# Patient Record
Sex: Female | Born: 1935 | Race: White | Hispanic: No | Smoking: Former smoker
Health system: Southern US, Community
[De-identification: ages and names within clinical notes are randomized; demographics above are authoritative.]

## PROBLEM LIST (undated history)

## (undated) DIAGNOSIS — C801 Malignant (primary) neoplasm, unspecified: Secondary | ICD-10-CM

## (undated) DIAGNOSIS — H409 Unspecified glaucoma: Secondary | ICD-10-CM

## (undated) HISTORY — PX: RECTAL PROLAPSE REPAIR: SHX759

## (undated) HISTORY — PX: VAGINAL PROLAPSE REPAIR: SHX830

## (undated) HISTORY — PX: CYSTECTOMY: SUR359

## (undated) HISTORY — PX: BREAST BIOPSY: SHX20

## (undated) HISTORY — DX: Unspecified glaucoma: H40.9

## (undated) HISTORY — PX: ABDOMINAL HYSTERECTOMY: SHX81

---

## 2005-10-07 ENCOUNTER — Ambulatory Visit: Payer: Self-pay | Admitting: Internal Medicine

## 2007-01-05 ENCOUNTER — Ambulatory Visit: Payer: Self-pay | Admitting: Internal Medicine

## 2008-01-09 ENCOUNTER — Ambulatory Visit: Payer: Self-pay | Admitting: Internal Medicine

## 2009-01-14 ENCOUNTER — Ambulatory Visit: Payer: Self-pay | Admitting: Internal Medicine

## 2010-01-21 ENCOUNTER — Ambulatory Visit: Payer: Self-pay | Admitting: Internal Medicine

## 2011-02-11 ENCOUNTER — Ambulatory Visit: Payer: Self-pay | Admitting: Internal Medicine

## 2011-04-07 ENCOUNTER — Emergency Department: Payer: Self-pay | Admitting: Internal Medicine

## 2011-04-15 ENCOUNTER — Ambulatory Visit: Payer: Self-pay | Admitting: Obstetrics and Gynecology

## 2011-04-15 DIAGNOSIS — I10 Essential (primary) hypertension: Secondary | ICD-10-CM

## 2011-04-26 ENCOUNTER — Ambulatory Visit: Payer: Self-pay | Admitting: Obstetrics and Gynecology

## 2012-07-10 ENCOUNTER — Ambulatory Visit: Payer: Self-pay | Admitting: Obstetrics and Gynecology

## 2012-09-02 ENCOUNTER — Emergency Department: Payer: Self-pay | Admitting: Internal Medicine

## 2012-09-02 LAB — COMPREHENSIVE METABOLIC PANEL
Albumin: 3.9 g/dL (ref 3.4–5.0)
Alkaline Phosphatase: 80 U/L (ref 50–136)
Anion Gap: 10 (ref 7–16)
Bilirubin,Total: 0.5 mg/dL (ref 0.2–1.0)
Calcium, Total: 8.7 mg/dL (ref 8.5–10.1)
Chloride: 96 mmol/L — ABNORMAL LOW (ref 98–107)
Co2: 23 mmol/L (ref 21–32)
EGFR (Non-African Amer.): >60
Glucose: 137 mg/dL — ABNORMAL HIGH (ref 65–99)
Potassium: 3.9 mmol/L (ref 3.5–5.1)
SGOT(AST): 23 U/L (ref 15–37)
Sodium: 129 mmol/L — ABNORMAL LOW (ref 136–145)
Total Protein: 6.9 g/dL (ref 6.4–8.2)

## 2012-09-02 LAB — CBC
HCT: 37.3 % (ref 35.0–47.0)
HGB: 13 g/dL (ref 12.0–16.0)
MCHC: 34.7 g/dL (ref 32.0–36.0)
MCV: 94 fL (ref 80–100)
RBC: 3.97 10*6/uL (ref 3.80–5.20)
RDW: 12.3 % (ref 11.5–14.5)
WBC: 8.8 10*3/uL (ref 3.6–11.0)

## 2012-09-08 ENCOUNTER — Ambulatory Visit: Payer: Self-pay | Admitting: Internal Medicine

## 2013-07-30 ENCOUNTER — Ambulatory Visit: Payer: Self-pay | Admitting: Obstetrics and Gynecology

## 2013-08-15 ENCOUNTER — Ambulatory Visit: Payer: Self-pay | Admitting: Obstetrics and Gynecology

## 2014-05-01 DIAGNOSIS — N368 Other specified disorders of urethra: Secondary | ICD-10-CM | POA: Insufficient documentation

## 2014-05-01 DIAGNOSIS — N3941 Urge incontinence: Secondary | ICD-10-CM | POA: Insufficient documentation

## 2014-05-01 DIAGNOSIS — N393 Stress incontinence (female) (male): Secondary | ICD-10-CM | POA: Insufficient documentation

## 2014-07-19 DIAGNOSIS — J019 Acute sinusitis, unspecified: Secondary | ICD-10-CM | POA: Insufficient documentation

## 2014-08-16 ENCOUNTER — Ambulatory Visit: Payer: Self-pay | Admitting: Otolaryngology

## 2014-09-11 ENCOUNTER — Ambulatory Visit: Payer: Self-pay | Admitting: Obstetrics and Gynecology

## 2014-10-28 DIAGNOSIS — R7309 Other abnormal glucose: Secondary | ICD-10-CM | POA: Insufficient documentation

## 2015-02-10 ENCOUNTER — Encounter: Payer: Self-pay | Admitting: Podiatry

## 2015-02-10 ENCOUNTER — Ambulatory Visit (INDEPENDENT_AMBULATORY_CARE_PROVIDER_SITE_OTHER): Payer: Medicare Other | Admitting: Podiatry

## 2015-02-10 VITALS — BP 149/86 | HR 73 | Resp 16 | Ht 61.0 in | Wt 112.0 lb

## 2015-02-10 DIAGNOSIS — C4370 Malignant melanoma of unspecified lower limb, including hip: Secondary | ICD-10-CM | POA: Insufficient documentation

## 2015-02-10 DIAGNOSIS — M79606 Pain in leg, unspecified: Secondary | ICD-10-CM

## 2015-02-10 DIAGNOSIS — E785 Hyperlipidemia, unspecified: Secondary | ICD-10-CM | POA: Insufficient documentation

## 2015-02-10 DIAGNOSIS — E871 Hypo-osmolality and hyponatremia: Secondary | ICD-10-CM | POA: Insufficient documentation

## 2015-02-10 DIAGNOSIS — B351 Tinea unguium: Secondary | ICD-10-CM | POA: Diagnosis not present

## 2015-02-10 DIAGNOSIS — N952 Postmenopausal atrophic vaginitis: Secondary | ICD-10-CM | POA: Insufficient documentation

## 2015-02-10 DIAGNOSIS — E041 Nontoxic single thyroid nodule: Secondary | ICD-10-CM | POA: Insufficient documentation

## 2015-02-10 DIAGNOSIS — H409 Unspecified glaucoma: Secondary | ICD-10-CM | POA: Insufficient documentation

## 2015-02-10 DIAGNOSIS — L719 Rosacea, unspecified: Secondary | ICD-10-CM | POA: Insufficient documentation

## 2015-02-10 DIAGNOSIS — E162 Hypoglycemia, unspecified: Secondary | ICD-10-CM | POA: Insufficient documentation

## 2015-02-10 NOTE — Progress Notes (Signed)
   Subjective:    Patient ID: Cathy Newman, female    DOB: 1925/05/31, 81 y.o.   MRN: [id number]  HPI i need my toenails trimmed and i feel like i am standing crooked. My right leg seems to be longer than the left .  So i may need something in my shoe to lift me up .     Review of Systems  All other systems reviewed and are negative.      Objective:   Physical Exam: I have reviewed her past mental history medications allergies surgery social history and review of systems. Pulses are strongly palpable bilateral. Neurologic sensorium is intact per Semmes-Weinstein monofilament. Deep tendon reflexes are intact bilateral and muscle strength is 5 over 5 dorsiflexion plantar flexors and inverters and everters all intrinsic musculature is intact. Orthopedic evaluation demonstrates severe hallux abductovalgus deformity hammertoe deformities bilateral. Minimal leg length discrepancy on evaluation of shoulders hips knees and medial malleolus. She does pronate more in her right foot. Cutaneous evaluation demonstrates supple well-hydrated cutis no erythema edema cellulitis drainage or odor. Her nails are thick yellow dystrophic onychomycotic and painful palpation.        Assessment & Plan:  Assessment: Assessment is pes planus right foot severe hallux valgus deformities bilateral and pain in limb secondary to onychomycosis bilateral.  Plan: Dispensed a pair of power step insoles and debrided nails 1 through 5 bilateral.

## 2015-05-12 ENCOUNTER — Ambulatory Visit (INDEPENDENT_AMBULATORY_CARE_PROVIDER_SITE_OTHER): Payer: Medicare Other | Admitting: Podiatry

## 2015-05-12 DIAGNOSIS — B351 Tinea unguium: Secondary | ICD-10-CM

## 2015-05-12 DIAGNOSIS — M79606 Pain in leg, unspecified: Secondary | ICD-10-CM | POA: Diagnosis not present

## 2015-05-12 NOTE — Progress Notes (Signed)
S: Returns today with CC of thickened/elongated nails  O: Pulses are intact with normal capillary fill time. Sensorium intact. 1-5 nails bilateral are thick     /dystrophic/mycotic and painful to palpation. Mild  Dorsal corn noted on R 2nd toe  A: onychomycosis 1-5 bilateral with pain  P: Debridement of painful 1-5 mycotic nails bilateral. Powerstep 3/4 length orthotics dispensed at pt.'s      Request, as previous pair quite helpful. Will see in 3 months as needed.

## 2015-05-13 DIAGNOSIS — M79673 Pain in unspecified foot: Secondary | ICD-10-CM

## 2015-05-26 ENCOUNTER — Other Ambulatory Visit: Payer: Self-pay | Admitting: Ophthalmology

## 2015-05-26 DIAGNOSIS — H34211 Partial retinal artery occlusion, right eye: Secondary | ICD-10-CM

## 2015-05-28 ENCOUNTER — Ambulatory Visit
Admission: RE | Admit: 2015-05-28 | Discharge: 2015-05-28 | Disposition: A | Discharge status: Home / Self Care | Payer: Medicare Other | Source: Ambulatory Visit | Attending: Ophthalmology | Admitting: Ophthalmology

## 2015-05-28 DIAGNOSIS — H34211 Partial retinal artery occlusion, right eye: Secondary | ICD-10-CM | POA: Diagnosis present

## 2015-05-28 DIAGNOSIS — I6523 Occlusion and stenosis of bilateral carotid arteries: Secondary | ICD-10-CM | POA: Diagnosis not present

## 2015-08-14 ENCOUNTER — Ambulatory Visit: Payer: Medicare Other

## 2015-08-19 ENCOUNTER — Encounter: Payer: Self-pay | Admitting: Obstetrics and Gynecology

## 2015-08-22 ENCOUNTER — Other Ambulatory Visit: Payer: Self-pay

## 2015-08-22 MED ORDER — ESTRADIOL 0.1 MG/GM VA CREA: 1.0000 | TOPICAL_CREAM | Freq: Every day | VAGINAL | Status: DC

## 2015-08-25 ENCOUNTER — Telehealth: Payer: Self-pay | Admitting: Obstetrics and Gynecology

## 2015-08-25 DIAGNOSIS — Z1231 Encounter for screening mammogram for malignant neoplasm of breast: Secondary | ICD-10-CM

## 2015-08-25 NOTE — Telephone Encounter (Signed)
[facility]ill on [facility]race [facility]) she needs her mammogram this falll if dr Tennis Must can order that.

## 2015-08-25 NOTE — Telephone Encounter (Signed)
Pt aware estrace erx on 9/16 and mammo ordered. Due after 10/7.

## 2015-08-28 ENCOUNTER — Ambulatory Visit (INDEPENDENT_AMBULATORY_CARE_PROVIDER_SITE_OTHER): Payer: Medicare Other | Admitting: Podiatry

## 2015-08-28 ENCOUNTER — Ambulatory Visit: Payer: Medicare Other

## 2015-08-28 ENCOUNTER — Encounter: Payer: Self-pay | Admitting: Podiatry

## 2015-08-28 DIAGNOSIS — M79606 Pain in leg, unspecified: Secondary | ICD-10-CM

## 2015-08-28 DIAGNOSIS — B351 Tinea unguium: Secondary | ICD-10-CM | POA: Diagnosis not present

## 2015-08-28 NOTE — Progress Notes (Signed)
Patient ID: Cathy Newman, female   DOB: 05-29-1925, 81 y.o.   MRN: [id number]  Subjective: 81 y.o. returns the office today for painful, elongated, thickened toenails and calluses which she is unable to trim herself. Denies any redness or drainage around the nails/calluses. Denies any acute changes since last appointment and no new complaints today. Denies any systemic complaints such as fevers, chills, nausea, vomiting.   Objective: AAO 3, NAD DP/PT pulses palpable, CRT less than 3 seconds Protective sensation intact with Simms Weinstein monofilament Nails hypertrophic, dystrophic, elongated, brittle, discolored 10. There is tenderness overlying the nails 1-5 bilaterally. There is no surrounding erythema or drainage along the nail sites. Right foot submetatarsal 2 hyperkeratotic lesion. Upon debridement there was no underlying ulceration, drainage or other signs of infection. No open lesions or pre-ulcerative lesions are identified. No other areas of tenderness bilateral lower extremities. No overlying edema, erythema, increased warmth. No pain with calf compression, swelling, warmth, erythema.  Assessment: Patient presents with symptomatic onychomycosis, hyperkerotic lesion  Plan: -Treatment options including alternatives, risks, complications were discussed -Nails sharply debrided 10 without complication/bleeding. -Hyperkeratotic lesion sharply debrided 1 without complication/bleeding. -Discussed daily foot inspection. If there are any changes, to call the office immediately.  -Follow-up in 3 months or sooner if any problems are to arise. In the meantime, encouraged to call the office with any questions, concerns, changes symptoms.  Celesta Gentile, DPM

## 2015-09-16 ENCOUNTER — Other Ambulatory Visit: Payer: Self-pay | Admitting: Obstetrics and Gynecology

## 2015-09-16 ENCOUNTER — Ambulatory Visit
Admission: RE | Admit: 2015-09-16 | Discharge: 2015-09-16 | Disposition: A | Discharge status: Home / Self Care | Payer: Medicare Other | Source: Ambulatory Visit | Attending: Obstetrics and Gynecology | Admitting: Obstetrics and Gynecology

## 2015-09-16 DIAGNOSIS — Z1231 Encounter for screening mammogram for malignant neoplasm of breast: Secondary | ICD-10-CM | POA: Diagnosis not present

## 2015-09-16 HISTORY — DX: Malignant (primary) neoplasm, unspecified: C80.1

## 2015-11-20 ENCOUNTER — Ambulatory Visit: Payer: Medicare Other

## 2015-11-21 ENCOUNTER — Ambulatory Visit (INDEPENDENT_AMBULATORY_CARE_PROVIDER_SITE_OTHER): Payer: Medicare Other | Admitting: Sports Medicine

## 2015-11-21 ENCOUNTER — Encounter: Payer: Self-pay | Admitting: Sports Medicine

## 2015-11-21 DIAGNOSIS — B351 Tinea unguium: Secondary | ICD-10-CM

## 2015-11-21 DIAGNOSIS — M2040 Other hammer toe(s) (acquired), unspecified foot: Secondary | ICD-10-CM

## 2015-11-21 DIAGNOSIS — M21619 Bunion of unspecified foot: Secondary | ICD-10-CM

## 2015-11-21 DIAGNOSIS — M79674 Pain in right toe(s): Secondary | ICD-10-CM

## 2015-11-21 DIAGNOSIS — M79675 Pain in left toe(s): Secondary | ICD-10-CM

## 2015-11-21 NOTE — Progress Notes (Signed)
Patient ID: Cathy Newman, female   DOB: 09-12-1925, 81 y.o.   MRN: [id number] Subjective: Cathy Newman is a 81 y.o. female patient seen today in office with complaint of painful thickened and elongated toenails; unable to trim. Patient denies history of Diabetes, Neuropathy, or Vascular disease. Patient has no other pedal complaints at this time.   Patient Active Problem List   Diagnosis Date Noted  . Atrophic vaginitis 02/10/2015  . Glaucoma 02/10/2015  . HLD (hyperlipidemia) 02/10/2015  . Hypoglycemia 02/10/2015  . Malignant melanoma of knee ([place]) 02/10/2015  . Acne erythematosa 02/10/2015  . Thyroid nodule 02/10/2015  . Below normal amount of sodium in the blood 02/10/2015  . Abnormal blood sugar 10/28/2014  . Acute infection of nasal sinus 07/19/2014   Current Outpatient Prescriptions on File Prior to Visit  Medication Sig Dispense Refill  . aspirin EC 81 MG tablet Take by mouth.    . estradiol (ESTRACE VAGINAL) 0.1 MG/GM vaginal cream Place 1 Applicatorful vaginally at bedtime. Use 1 gram vaginally two times a week and apply topically nightly to urethral meatus. 42.5 g 3  . LUMIGAN 0.01 % SOLN     . timolol (TIMOPTIC) 0.5 % ophthalmic solution      No current facility-administered medications on file prior to visit.   Allergies  Allergen Reactions  . Amoxicillin Hives  . Brinzolamide Other (See Comments)  . Other Other (See Comments)    MYCINS     Objective: Physical Exam  General: Well developed, nourished, no acute distress, awake, alert and oriented x 3  Vascular: Dorsalis pedis artery 1/4 bilateral, Posterior tibial artery 1/4 bilateral, skin temperature warm to warm proximal to distal bilateral lower extremities, + varicosities, scant pedal hair present bilateral.  Neurological: Gross sensation present via light touch bilateral.   Dermatological: Skin is warm, dry, and supple bilateral, Nails 1-10 are tender, long, thick, and discolored with mild subungal  debris, no webspace macerations present bilateral, no open lesions present bilateral, no callus/corns/hyperkeratotic tissue present bilateral. No signs of infection bilateral.  Musculoskeletal: Asymptomatic Bunion and hammertoe boney deformities noted bilateral. Muscular strength within normal limits without pain on range of motion. No pain with calf compression bilateral.  Assessment and Plan:  Problem List Items Addressed This Visit    None    Visit Diagnoses    Dermatophytosis of nail    -  Primary    Toe pain, bilateral        Bunion        Hammertoe, unspecified laterality         -Examined patient.  -Discussed treatment options for painful mycotic nails. -Mechanically debrided and reduced mycotic nails with sterile nail nipper and dremel nail file without incident. -Recommend good supportive shoes daily for foot type.  -Patient to return in 3 months for follow up evaluation or sooner if symptoms worsen.  Landis Martins, DPM

## 2016-02-20 ENCOUNTER — Ambulatory Visit: Payer: Medicare Other | Admitting: Sports Medicine

## 2016-02-24 ENCOUNTER — Encounter: Payer: Self-pay | Admitting: Sports Medicine

## 2016-02-24 ENCOUNTER — Ambulatory Visit (INDEPENDENT_AMBULATORY_CARE_PROVIDER_SITE_OTHER): Payer: Medicare Other | Admitting: Sports Medicine

## 2016-02-24 DIAGNOSIS — B351 Tinea unguium: Secondary | ICD-10-CM | POA: Diagnosis not present

## 2016-02-24 DIAGNOSIS — M79674 Pain in right toe(s): Secondary | ICD-10-CM

## 2016-02-24 DIAGNOSIS — M21619 Bunion of unspecified foot: Secondary | ICD-10-CM

## 2016-02-24 DIAGNOSIS — M2040 Other hammer toe(s) (acquired), unspecified foot: Secondary | ICD-10-CM

## 2016-02-24 DIAGNOSIS — M79675 Pain in left toe(s): Secondary | ICD-10-CM

## 2016-02-24 NOTE — Progress Notes (Signed)
Patient ID: Cathy Newman, female   DOB: 02-Feb-1925, 82 y.o.   MRN: [id number]  Subjective: Cathy Newman is a 82 y.o. female patient seen today in office with complaint of painful thickened and elongated toenails; unable to trim. Patient denies any changes with medical history since last visit. Patient has no other pedal complaints at this time.   Patient Active Problem List   Diagnosis Date Noted  . Atrophic vaginitis 02/10/2015  . Glaucoma 02/10/2015  . HLD (hyperlipidemia) 02/10/2015  . Hypoglycemia 02/10/2015  . Malignant melanoma of knee ([place]) 02/10/2015  . Acne erythematosa 02/10/2015  . Thyroid nodule 02/10/2015  . Below normal amount of sodium in the blood 02/10/2015  . Abnormal blood sugar 10/28/2014  . Acute infection of nasal sinus 07/19/2014  . Female genuine stress incontinence 05/01/2014  . Prolapse of urethra 05/01/2014  . Urge incontinence of urine 05/01/2014   Current Outpatient Prescriptions on File Prior to Visit  Medication Sig Dispense Refill  . aspirin EC 81 MG tablet Take by mouth.    . estradiol (ESTRACE VAGINAL) 0.1 MG/GM vaginal cream Place 1 Applicatorful vaginally at bedtime. Use 1 gram vaginally two times a week and apply topically nightly to urethral meatus. 42.5 g 3  . LUMIGAN 0.01 % SOLN     . timolol (TIMOPTIC) 0.5 % ophthalmic solution      No current facility-administered medications on file prior to visit.   Allergies  Allergen Reactions  . Amoxicillin Hives  . Brinzolamide Other (See Comments)  . Other Other (See Comments)    MYCINS     Objective: Physical Exam  General: Well developed, nourished, no acute distress, awake, alert and oriented x 3  Vascular: Dorsalis pedis artery 1/4 bilateral, Posterior tibial artery 1/4 bilateral, skin temperature warm to warm proximal to distal bilateral lower extremities, + varicosities, scant pedal hair present bilateral.  Neurological: Gross sensation present via light touch bilateral.    Dermatological: Skin is warm, dry, and supple bilateral, Nails 1-10 are tender, long, thick, and discolored with mild subungal debris, no webspace macerations present bilateral, no open lesions present bilateral, no callus/corns/hyperkeratotic tissue present bilateral. No signs of infection bilateral.  Musculoskeletal: Asymptomatic Bunion and hammertoe boney deformities noted bilateral. Muscular strength within normal limits without pain on range of motion. No pain with calf compression bilateral.  Assessment and Plan:  Problem List Items Addressed This Visit    None    Visit Diagnoses    Dermatophytosis of nail    -  Primary    Toe pain, bilateral        Bunion        Hammertoe, unspecified laterality         -Examined patient.  -Discussed treatment options for painful mycotic nails. -Mechanically debrided and reduced mycotic nails with sterile nail nipper and dremel nail file without incident. -Recommend good supportive shoes daily for foot type.  -Patient to return in 3 months for follow up evaluation or sooner if symptoms worsen.  Landis Martins, DPM

## 2016-05-28 ENCOUNTER — Encounter: Payer: Self-pay | Admitting: Sports Medicine

## 2016-05-28 ENCOUNTER — Ambulatory Visit (INDEPENDENT_AMBULATORY_CARE_PROVIDER_SITE_OTHER): Payer: Medicare Other | Admitting: Sports Medicine

## 2016-05-28 DIAGNOSIS — M79674 Pain in right toe(s): Secondary | ICD-10-CM

## 2016-05-28 DIAGNOSIS — M79675 Pain in left toe(s): Secondary | ICD-10-CM | POA: Diagnosis not present

## 2016-05-28 DIAGNOSIS — B351 Tinea unguium: Secondary | ICD-10-CM

## 2016-05-28 DIAGNOSIS — M2040 Other hammer toe(s) (acquired), unspecified foot: Secondary | ICD-10-CM

## 2016-05-28 DIAGNOSIS — M21619 Bunion of unspecified foot: Secondary | ICD-10-CM

## 2016-05-28 NOTE — Progress Notes (Signed)
Patient ID: Cathy Newman, female   DOB: Apr 13, 1925, 82 y.o.   MRN: [id number]   Subjective: Cathy Newman is a 82 y.o. female patient seen today in office with complaint of painful thickened and elongated toenails; unable to trim. Patient denies any changes with medical history since last visit. Patient has no other pedal complaints at this time.   Patient Active Problem List   Diagnosis Date Noted  . Atrophic vaginitis 02/10/2015  . Glaucoma 02/10/2015  . HLD (hyperlipidemia) 02/10/2015  . Hypoglycemia 02/10/2015  . Malignant melanoma of knee ([place]) 02/10/2015  . Acne erythematosa 02/10/2015  . Thyroid nodule 02/10/2015  . Below normal amount of sodium in the blood 02/10/2015  . Abnormal blood sugar 10/28/2014  . Abnormal glucose level 10/28/2014  . Acute infection of nasal sinus 07/19/2014  . Female genuine stress incontinence 05/01/2014  . Prolapse of urethra 05/01/2014  . Urge incontinence of urine 05/01/2014   Current Outpatient Prescriptions on File Prior to Visit  Medication Sig Dispense Refill  . aspirin EC 81 MG tablet Take by mouth.    . ciprofloxacin (CIPRO) 250 MG tablet     . estradiol (ESTRACE VAGINAL) 0.1 MG/GM vaginal cream Place 1 Applicatorful vaginally at bedtime. Use 1 gram vaginally two times a week and apply topically nightly to urethral meatus. 42.5 g 3  . LUMIGAN 0.01 % SOLN     . timolol (TIMOPTIC) 0.5 % ophthalmic solution      No current facility-administered medications on file prior to visit.   Allergies  Allergen Reactions  . Amoxicillin Hives  . Brinzolamide Other (See Comments)  . Other Other (See Comments)    MYCINS     Objective: Physical Exam  General: Well developed, nourished, no acute distress, awake, alert and oriented x 3  Vascular: Dorsalis pedis artery 1/4 bilateral, Posterior tibial artery 1/4 bilateral, skin temperature warm to warm proximal to distal bilateral lower extremities, + varicosities, scant pedal hair present  bilateral.  Neurological: Gross sensation present via light touch bilateral.   Dermatological: Skin is warm, dry, and supple bilateral, Nails 1-10 are tender, long, thick, and discolored with mild subungal debris, no webspace macerations present bilateral, no open lesions present bilateral, no callus/corns/hyperkeratotic tissue present bilateral. No signs of infection bilateral.  Musculoskeletal: Asymptomatic Bunion and hammertoe boney deformities noted bilateral. Muscular strength within normal limits without pain on range of motion. No pain with calf compression bilateral.  Assessment and Plan:  Problem List Items Addressed This Visit    None    Visit Diagnoses    Dermatophytosis of nail    -  Primary    Toe pain, bilateral        Bunion        Hammertoe, unspecified laterality         -Examined patient.  -Discussed treatment options for painful mycotic nails. -Mechanically debrided and reduced mycotic nails with sterile nail nipper and dremel nail file without incident. -Recommend good supportive shoes daily for foot type.  -Patient to return in 3 months for follow up evaluation or sooner if symptoms worsen.  Landis Martins, DPM

## 2016-06-01 ENCOUNTER — Ambulatory Visit: Payer: Medicare Other | Admitting: Sports Medicine

## 2016-08-10 ENCOUNTER — Ambulatory Visit: Payer: Medicare Other | Admitting: Sports Medicine

## 2016-08-13 ENCOUNTER — Ambulatory Visit (INDEPENDENT_AMBULATORY_CARE_PROVIDER_SITE_OTHER): Payer: Medicare Other | Admitting: Podiatry

## 2016-08-13 ENCOUNTER — Encounter: Payer: Self-pay | Admitting: Podiatry

## 2016-08-13 DIAGNOSIS — L84 Corns and callosities: Secondary | ICD-10-CM | POA: Diagnosis not present

## 2016-08-13 DIAGNOSIS — B351 Tinea unguium: Secondary | ICD-10-CM

## 2016-08-13 DIAGNOSIS — M79674 Pain in right toe(s): Secondary | ICD-10-CM | POA: Diagnosis not present

## 2016-08-13 DIAGNOSIS — M2040 Other hammer toe(s) (acquired), unspecified foot: Secondary | ICD-10-CM

## 2016-08-13 DIAGNOSIS — M79675 Pain in left toe(s): Secondary | ICD-10-CM | POA: Diagnosis not present

## 2016-08-13 DIAGNOSIS — M21619 Bunion of unspecified foot: Secondary | ICD-10-CM

## 2016-08-13 NOTE — Progress Notes (Signed)
SUBJECTIVE Patient presents to office today complaining of elongated, thickened nails. Pain while ambulating in shoes. Patient is unable to trim their own nails.   Allergies  Allergen Reactions  . Amoxicillin Hives  . Brinzolamide Other (See Comments)  . Other Other (See Comments)    [place] General Patient is awake, alert, and oriented x 3 and in no acute distress. Derm Painful hyperkeratotic lesions noted on the distal tufts of digits 2 and 3 right foot. Skin is dry and supple bilateral. Negative open lesions or macerations. Remaining integument unremarkable. Nails are tender, long, thickened and dystrophic with subungual debris, consistent with onychomycosis, 1-5 bilateral. No signs of infection noted. Vasc  DP and PT pedal pulses palpable bilaterally. Temperature gradient within normal limits.  Neuro Epicritic and protective threshold sensation diminished bilaterally.  Musculoskeletal Exam No symptomatic pedal deformities noted bilateral. Muscular strength within normal limits.  ASSESSMENT 1. Onychomycosis of nail due to dermatophyte bilateral 3. Pain in foot and toes bilateral 4. Bunion deformity bilateral 5. Hammertoe deformity digits 2 through 5 bilateral  PLAN OF CARE Patient evaluated today. Instructed to maintain good pedal hygiene and foot care. Stressed importance of maintaining healthy lifestyle. Mechanical debridement of nails 1-5 bilaterally performed using a nail nipper. Filed with dremel without incident.  Debridement of callus lesions on the distal tufts of digits 2 and 3 right foot was performed using a  tissue nipper. All patient questions were answered. Return to clinic in 3 mos.    Edrick Kins, DPM

## 2016-09-01 ENCOUNTER — Other Ambulatory Visit: Payer: Self-pay | Admitting: Internal Medicine

## 2016-09-01 DIAGNOSIS — Z1231 Encounter for screening mammogram for malignant neoplasm of breast: Secondary | ICD-10-CM

## 2016-09-29 ENCOUNTER — Ambulatory Visit
Admission: RE | Admit: 2016-09-29 | Discharge: 2016-09-29 | Disposition: A | Discharge status: Home / Self Care | Payer: Medicare Other | Source: Ambulatory Visit | Attending: Internal Medicine | Admitting: Internal Medicine

## 2016-09-29 DIAGNOSIS — Z1231 Encounter for screening mammogram for malignant neoplasm of breast: Secondary | ICD-10-CM

## 2016-10-18 ENCOUNTER — Encounter: Payer: Self-pay | Admitting: Emergency Medicine

## 2016-10-18 ENCOUNTER — Emergency Department: Payer: Medicare Other

## 2016-10-18 ENCOUNTER — Emergency Department
Admission: EM | Admit: 2016-10-18 | Discharge: 2016-10-18 | Disposition: A | Discharge status: Home / Self Care | Payer: Medicare Other | Attending: Student in an Organized Health Care Education/Training Program | Admitting: Student in an Organized Health Care Education/Training Program

## 2016-10-18 DIAGNOSIS — Z85830 Personal history of malignant neoplasm of bone: Secondary | ICD-10-CM | POA: Diagnosis not present

## 2016-10-18 DIAGNOSIS — W19XXXA Unspecified fall, initial encounter: Secondary | ICD-10-CM

## 2016-10-18 DIAGNOSIS — Z23 Encounter for immunization: Secondary | ICD-10-CM | POA: Diagnosis not present

## 2016-10-18 DIAGNOSIS — Y929 Unspecified place or not applicable: Secondary | ICD-10-CM | POA: Insufficient documentation

## 2016-10-18 DIAGNOSIS — Z7982 Long term (current) use of aspirin: Secondary | ICD-10-CM | POA: Insufficient documentation

## 2016-10-18 DIAGNOSIS — S0990XA Unspecified injury of head, initial encounter: Secondary | ICD-10-CM | POA: Diagnosis present

## 2016-10-18 DIAGNOSIS — W01198A Fall on same level from slipping, tripping and stumbling with subsequent striking against other object, initial encounter: Secondary | ICD-10-CM | POA: Diagnosis not present

## 2016-10-18 DIAGNOSIS — S0001XA Abrasion of scalp, initial encounter: Secondary | ICD-10-CM

## 2016-10-18 DIAGNOSIS — S0003XA Contusion of scalp, initial encounter: Secondary | ICD-10-CM | POA: Diagnosis not present

## 2016-10-18 DIAGNOSIS — Z87891 Personal history of nicotine dependence: Secondary | ICD-10-CM | POA: Insufficient documentation

## 2016-10-18 DIAGNOSIS — Y939 Activity, unspecified: Secondary | ICD-10-CM | POA: Diagnosis not present

## 2016-10-18 DIAGNOSIS — Y999 Unspecified external cause status: Secondary | ICD-10-CM | POA: Insufficient documentation

## 2016-10-18 DIAGNOSIS — Z79899 Other long term (current) drug therapy: Secondary | ICD-10-CM | POA: Insufficient documentation

## 2016-10-18 MED ORDER — TETANUS-DIPHTH-ACELL PERTUSSIS 5-2.5-18.5 LF-MCG/0.5 IM SUSP
0.5000 mL | Freq: Once | INTRAMUSCULAR | Status: AC
  Administered 2016-10-18: 0.5 mL via INTRAMUSCULAR
  Filled 2016-10-18: qty 0.5

## 2016-10-18 NOTE — Discharge Instructions (Signed)
Cleaning area on scalp twice daily with mild soap and water. Apply ice to reduce swelling and help with pain. Follow-up with your primary care doctor if any continued problems or signs of infection. Return to the emergency room immediately if any signs of head injury that are listed on your discharge papers. This information is for your information only. You have given a tetanus booster today.

## 2016-10-18 NOTE — ED Triage Notes (Signed)
Tripped and fell, hit head, small lac noted, bleeding controlled, bandage applied. No LOC

## 2016-10-18 NOTE — ED Provider Notes (Signed)
[facility] Emergency Department Provider Note   ____________________________________________   First MD Initiated Contact with Patient 10/18/16 1539     (approximate)  I have reviewed the triage vital signs and the nursing notes.   HISTORY  Chief Complaint Fall   HPI Cathy Newman is a 82 y.o. female is brought in today after she tripped over her cat and fell against her car. Patient states she hit her head and noticed some bleeding. She denies any other injuries other than her scalp laceration. Patient denies any loss of consciousness or changes in her vision. She denies any nausea or vomiting. Patient denies any paresthesias in her extremities. Patient believes that the last tetanus booster she was given was probably between 10 and 15 years ago.At present she denies any headache. She rates her pain as a 3 out of 10.   Past Medical History:  Diagnosis Date  . Cancer ([place])    melanoma on knee  . Glaucoma     Patient Active Problem List   Diagnosis Date Noted  . Atrophic vaginitis 02/10/2015  . Glaucoma 02/10/2015  . HLD (hyperlipidemia) 02/10/2015  . Hypoglycemia 02/10/2015  . Malignant melanoma of knee ([place]) 02/10/2015  . Acne erythematosa 02/10/2015  . Thyroid nodule 02/10/2015  . Below normal amount of sodium in the blood 02/10/2015  . Abnormal blood sugar 10/28/2014  . Abnormal glucose level 10/28/2014  . Acute infection of nasal sinus 07/19/2014  . Female genuine stress incontinence 05/01/2014  . Prolapse of urethra 05/01/2014  . Urge incontinence of urine 05/01/2014    Past Surgical History:  Procedure Laterality Date  . ABDOMINAL HYSTERECTOMY    . BREAST BIOPSY Right   . CYSTECTOMY    . RECTAL PROLAPSE REPAIR    . VAGINAL PROLAPSE REPAIR      Prior to Admission medications   Medication Sig Start Date End Date Taking? Authorizing Provider  aspirin EC 81 MG tablet Take by mouth.    Historical Provider, MD  ciprofloxacin  (CIPRO) 250 MG tablet  01/19/16   Historical Provider, MD  estradiol (ESTRACE VAGINAL) 0.1 MG/GM vaginal cream Place 1 Applicatorful vaginally at bedtime. Use 1 gram vaginally two times a week and apply topically nightly to urethral meatus. 08/22/15   Alanda Slim Defrancesco, MD  LUMIGAN 0.01 % SOLN  01/06/15   Historical Provider, MD  timolol (TIMOPTIC) 0.5 % ophthalmic solution  01/11/15   Historical Provider, MD    Allergies Amoxicillin; Brinzolamide; and Other  Family History  Problem Relation Age of Onset  . Breast cancer Sister     Social History Social History  Substance Use Topics  . Smoking status: Former Research scientist (life sciences)  . Smokeless tobacco: Never Used  . Alcohol use Not on file    Review of Systems Constitutional: No fever/chills Eyes: No visual changes. ENT:No trauma Cardiovascular: Denies chest pain. Respiratory: Denies shortness of breath. Gastrointestinal: No abdominal pain.  No nausea, no vomiting.   Musculoskeletal: Negative for back pain. Positive for neck pain. Skin: Positive for laceration. Neurological: Negative for headaches, focal weakness or numbness.  10-point ROS otherwise negative.  ____________________________________________   PHYSICAL EXAM:  VITAL SIGNS: ED Triage Vitals  Enc Vitals Group     BP 10/18/16 1524 (!) 170/74     Pulse Rate 10/18/16 1524 69     Resp 10/18/16 1524 18     Temp 10/18/16 1524 98 F (36.7 C)     Temp Source 10/18/16 1524 Oral  SpO2 10/18/16 1524 98 %     Weight 10/18/16 1524 112 lb (50.8 kg)     Height 10/18/16 1524 5\' 1"  (1.549 m)     Head Circumference --      Peak Flow --      Pain Score 10/18/16 1507 3     Pain Loc --      Pain Edu? --      Excl. in [place]? --     Constitutional: Alert and oriented. Well appearing and in no acute distress. Patient is talkative and answers questions appropriately. Eyes: Conjunctivae are normal. PERRL. EOMI. Head: Atraumatic. Nose: No congestion/rhinnorhea. Neck: No stridor.  No point  tenderness on palpation cervical spine. There is some cervical muscle tenderness on palpation. No trapezius muscle tenderness was noted. No gross deformity observed. Cardiovascular: Normal rate, regular rhythm. Grossly normal heart sounds.  Good peripheral circulation. Respiratory: Normal respiratory effort.  No retractions. Lungs CTAB. Gastrointestinal: Soft and nontender. No distention.  Musculoskeletal: As appropriate and lower extremities without any difficulty. Neurologic:  Normal speech and language. No gross focal neurologic deficits are appreciated. Cranial nerves II through XII grossly intact. Skin:  Skin is warm, dry. There is superficial abrasion posterior scalp without active bleeding at this time. There is also a soft tissue hematoma present. Area is moderately tender to palpation. Psychiatric: Mood and affect are normal. Speech and behavior are normal.  ____________________________________________   LABS (all labs ordered are listed, but only abnormal results are displayed)  Labs Reviewed - No data to display  RADIOLOGY  CT head without contrast and cervical spine per radiologist: IMPRESSION:  1. Moderate right occipital scalp subgaleal hematoma.  2. No evidence of acute intracranial abnormality. No evidence of  calvarial fracture.  3. Mild chronic small vessel ischemia.  4. No cervical spine fracture.  5. Moderate degenerative changes in the cervical spine as described.  6. Minimal 2 mm anterolisthesis at C2-3 and C3-4, probably  degenerative.    ____________________________________________   PROCEDURES  Procedure(s) performed: Area was cleaned by myself with surgical soap. There is no foreign bodies noted. There was no continued bleeding. No sutures or staples were required.  Procedures  Critical Care performed: No  ____________________________________________   INITIAL IMPRESSION / ASSESSMENT AND PLAN / ED COURSE  Pertinent labs & imaging results that  were available during my care of the patient were reviewed by me and considered in my medical decision making (see chart for details).    Clinical Course    Patient was reassured that her CT scan results. Patient was given a tetanus booster. Patient was given instructions to clean the area daily with mild soap and water. She is also given  information about head injuries and scalp contusions. She was given an ice pack prior to her discharge from the emergency room. Patient remained alert, oriented, talkative and cooperative in the emergency room. Family member with her states that she has been at her baseline.   ____________________________________________   FINAL CLINICAL IMPRESSION(S) / ED DIAGNOSES  Final diagnoses:  Contusion of scalp, initial encounter  Scalp abrasion, initial encounter  Fall, initial encounter      NEW MEDICATIONS STARTED DURING THIS VISIT:  Discharge Medication List as of 10/18/2016  5:10 PM       Note:  This document was prepared using Dragon voice recognition software and may include unintentional dictation errors.    Johnn Hai, PA-C 10/18/16 [place], MD 10/19/16 2217

## 2016-11-01 ENCOUNTER — Other Ambulatory Visit: Payer: Self-pay | Admitting: Obstetrics and Gynecology

## 2016-11-02 ENCOUNTER — Ambulatory Visit (INDEPENDENT_AMBULATORY_CARE_PROVIDER_SITE_OTHER): Payer: Medicare Other | Admitting: Podiatry

## 2016-11-02 DIAGNOSIS — L608 Other nail disorders: Secondary | ICD-10-CM

## 2016-11-02 DIAGNOSIS — M79609 Pain in unspecified limb: Secondary | ICD-10-CM | POA: Diagnosis not present

## 2016-11-02 DIAGNOSIS — B351 Tinea unguium: Secondary | ICD-10-CM | POA: Diagnosis not present

## 2016-11-02 DIAGNOSIS — L603 Nail dystrophy: Secondary | ICD-10-CM

## 2016-11-03 NOTE — Progress Notes (Signed)
SUBJECTIVE Patient  presents to office today complaining of elongated, thickened nails. Pain while ambulating in shoes. Patient is unable to trim their own nails.   OBJECTIVE General Patient is awake, alert, and oriented x 3 and in no acute distress. Derm Skin is dry and supple bilateral. Negative open lesions or macerations. Remaining integument unremarkable. Nails are tender, long, thickened and dystrophic with subungual debris, consistent with onychomycosis, 1-5 bilateral. No signs of infection noted. Vasc  DP and PT pedal pulses palpable bilaterally. Temperature gradient within normal limits.  Neuro Epicritic and protective threshold sensation diminished bilaterally.  Musculoskeletal Exam No symptomatic pedal deformities noted bilateral. Muscular strength within normal limits.  ASSESSMENT 1. Onychodystrophic nails 1-5 bilateral with hyperkeratosis of nails.  2. Onychomycosis of nail due to dermatophyte bilateral 3. Pain in foot bilateral  PLAN OF CARE 1. Patient evaluated today.  2. Instructed to maintain good pedal hygiene and foot care.  3. Mechanical debridement of nails 1-5 bilaterally performed using a nail nipper. Filed with dremel without incident.  4. Return to clinic in 3 mos.    Wynetta Seith M Joffre Lucks, DPM    

## 2016-11-12 ENCOUNTER — Ambulatory Visit: Payer: Medicare Other | Admitting: Podiatry

## 2017-02-03 ENCOUNTER — Ambulatory Visit: Payer: Medicare Other | Admitting: Podiatry

## 2017-02-07 ENCOUNTER — Ambulatory Visit (INDEPENDENT_AMBULATORY_CARE_PROVIDER_SITE_OTHER): Payer: Medicare Other | Admitting: Podiatry

## 2017-02-07 ENCOUNTER — Encounter: Payer: Self-pay | Admitting: Podiatry

## 2017-02-07 DIAGNOSIS — B351 Tinea unguium: Secondary | ICD-10-CM | POA: Diagnosis not present

## 2017-02-07 DIAGNOSIS — M79609 Pain in unspecified limb: Secondary | ICD-10-CM | POA: Diagnosis not present

## 2017-02-07 DIAGNOSIS — M21619 Bunion of unspecified foot: Secondary | ICD-10-CM

## 2017-02-07 NOTE — Progress Notes (Signed)
Complaint:  Visit Type: Patient returns to my office for continued preventative foot care services. Complaint: Patient states" my nails have grown long and thick and become painful to walk and wear shoes" The patient presents for preventative foot care services. No changes to ROS  Podiatric Exam: Vascular: dorsalis pedis and posterior tibial pulses are palpable bilateral. Capillary return is immediate. Temperature gradient is WNL. Skin turgor WNL  Sensorium: Normal Semmes Weinstein monofilament test. Normal tactile sensation bilaterally. Nail Exam: Pt has thick disfigured discolored nails with subungual debris noted bilateral entire nail hallux through fifth toenails Ulcer Exam: There is no evidence of ulcer or pre-ulcerative changes or infection. Orthopedic Exam: Muscle tone and strength are WNL. No limitations in general ROM. No crepitus or effusions noted. Severe HAV  B/L. Skin: No Porokeratosis. No infection or ulcers  Diagnosis:  Onychomycosis, , Pain in right toe, pain in left toes  Treatment & Plan Procedures and Treatment: Consent by patient was obtained for treatment procedures. The patient understood the discussion of treatment and procedures well. All questions were answered thoroughly reviewed. Debridement of mycotic and hypertrophic toenails, 1 through 5 bilateral and clearing of subungual debris. No ulceration, no infection noted.  Return Visit-Office Procedure: Patient instructed to return to the office for a follow up visit 3 months for continued evaluation and treatment.    Kia Varnadore DPM 

## 2017-05-16 ENCOUNTER — Ambulatory Visit (INDEPENDENT_AMBULATORY_CARE_PROVIDER_SITE_OTHER): Payer: Medicare Other | Admitting: Podiatry

## 2017-05-16 DIAGNOSIS — B351 Tinea unguium: Secondary | ICD-10-CM

## 2017-05-16 DIAGNOSIS — M79609 Pain in unspecified limb: Secondary | ICD-10-CM | POA: Diagnosis not present

## 2017-05-16 DIAGNOSIS — M21619 Bunion of unspecified foot: Secondary | ICD-10-CM

## 2017-05-16 NOTE — Progress Notes (Signed)
Complaint:  Visit Type: Patient returns to my office for continued preventative foot care services. Complaint: Patient states" my nails have grown long and thick and become painful to walk and wear shoes" The patient presents for preventative foot care services. No changes to ROS  Podiatric Exam: Vascular: dorsalis pedis and posterior tibial pulses are palpable bilateral. Capillary return is immediate. Temperature gradient is WNL. Skin turgor WNL  Sensorium: Normal Semmes Weinstein monofilament test. Normal tactile sensation bilaterally. Nail Exam: Pt has thick disfigured discolored nails with subungual debris noted bilateral entire nail hallux through fifth toenails Ulcer Exam: There is no evidence of ulcer or pre-ulcerative changes or infection. Orthopedic Exam: Muscle tone and strength are WNL. No limitations in general ROM. No crepitus or effusions noted. Severe HAV  B/L. Skin: No Porokeratosis. No infection or ulcers  Diagnosis:  Onychomycosis, , Pain in right toe, pain in left toes  Treatment & Plan Procedures and Treatment: Consent by patient was obtained for treatment procedures. The patient understood the discussion of treatment and procedures well. All questions were answered thoroughly reviewed. Debridement of mycotic and hypertrophic toenails, 1 through 5 bilateral and clearing of subungual debris. No ulceration, no infection noted.  Return Visit-Office Procedure: Patient instructed to return to the office for a follow up visit 3 months for continued evaluation and treatment.    Holle Sprick DPM 

## 2017-06-15 ENCOUNTER — Encounter: Payer: Self-pay | Admitting: Urology

## 2017-06-15 ENCOUNTER — Ambulatory Visit (INDEPENDENT_AMBULATORY_CARE_PROVIDER_SITE_OTHER): Payer: Medicare Other | Admitting: Urology

## 2017-06-15 VITALS — BP 164/75 | HR 74 | Ht 60.0 in | Wt 116.1 lb

## 2017-06-15 DIAGNOSIS — N368 Other specified disorders of urethra: Secondary | ICD-10-CM | POA: Diagnosis not present

## 2017-06-15 NOTE — Progress Notes (Signed)
06/15/2017 3:20 PM   Denny Peon Lanell Matar 25-Sep-1925 [id number]  Referring provider: Idelle Crouch, MD [facility] [place], [place] [address]  Chief Complaint  Patient presents with  . New Patient (Initial Visit)    Urethritis    HPI: The patient is a 83 year old female with a past medical history of atrophic vaginitis on Estrace cream twice per week and urethral prolapse presents today for evaluation of her urethral prolapse. This was diagnosed many years ago and she has not seen a urologist recently so she wanted to establish care here. She has no urinary symptoms at this time. She voids well without hesitancy. She has a good stream. She feels that she empties her bladder. She denies dysuria. She has no hematuria or history of nephrolithiasis. She does not get frequent urinary tract infections. She has 0 complaints at this time and only presents to establish care office.    PMH: Past Medical History:  Diagnosis Date  . Cancer ([place])    melanoma on knee  . Glaucoma     Surgical History: Past Surgical History:  Procedure Laterality Date  . ABDOMINAL HYSTERECTOMY    . BREAST BIOPSY Right   . CYSTECTOMY    . RECTAL PROLAPSE REPAIR    . VAGINAL PROLAPSE REPAIR      Home Medications:  Allergies as of 06/15/2017      Reactions   Amoxicillin Hives   Brinzolamide Other (See Comments)   Other Other (See Comments)   MYCINS MYCINS      Medication List       Accurate as of 06/15/17  3:20 PM. Always use your most recent med list.          aspirin EC 81 MG tablet Take by mouth.   ESTRACE VAGINAL 0.1 MG/GM vaginal cream Generic drug:  estradiol USE 1 GRAM VAGINALLY TWO TIMES A WEEK AND APPLY TOPICALLY NIGHTLY TO URETHRAL MEATUS   LUMIGAN 0.01 % Soln Generic drug:  bimatoprost   timolol 0.5 % ophthalmic solution Commonly known as:  TIMOPTIC       Allergies:  Allergies  Allergen Reactions  . Amoxicillin Hives  . Brinzolamide Other  (See Comments)  . Other Other (See Comments)    MYCINS MYCINS    Family History: Family History  Problem Relation Age of Onset  . Breast cancer Sister   . Bladder Cancer Neg Hx   . Kidney cancer Neg Hx     Social History:  reports that she has quit smoking. She has never used smokeless tobacco. Her alcohol and drug histories are not on file.  ROS: UROLOGY Frequent Urination?: No Hard to postpone urination?: No Burning/pain with urination?: No Get up at night to urinate?: Yes Leakage of urine?: Yes Urine stream starts and stops?: Yes Trouble starting stream?: No Do you have to strain to urinate?: No Blood in urine?: No Urinary tract infection?: No Sexually transmitted disease?: No Injury to kidneys or bladder?: No Painful intercourse?: No Weak stream?: No Currently pregnant?: No Vaginal bleeding?: No Last menstrual period?: n  Gastrointestinal Nausea?: No Vomiting?: No Indigestion/heartburn?: No Diarrhea?: No Constipation?: No  Constitutional Fever: No Night sweats?: No Weight loss?: No Fatigue?: No  Skin Skin rash/lesions?: No Itching?: No  Eyes Blurred vision?: No Double vision?: No  Ears/Nose/Throat Sore throat?: No Sinus problems?: No  Hematologic/Lymphatic Swollen glands?: No Easy bruising?: No  Cardiovascular Leg swelling?: No Chest pain?: No  Respiratory Cough?: No Shortness of breath?: No  Endocrine  Excessive thirst?: No  Musculoskeletal Back pain?: No Joint pain?: No  Neurological Headaches?: No Dizziness?: No  Psychologic Depression?: No Anxiety?: No  Physical Exam: BP (!) 164/75 (BP Location: Left Arm, Patient Position: Sitting, Cuff Size: Normal)   Pulse 74   Ht 5' (1.524 m)   Wt 116 lb 1.6 oz (52.7 kg)   BMI 22.67 kg/m   Constitutional:  Alert and oriented, No acute distress. HEENT: [place] AT, moist mucus membranes.  Trachea midline, no masses. Cardiovascular: No clubbing, cyanosis, or edema. Respiratory: Normal  respiratory effort, no increased work of breathing. GI: Abdomen is soft, nontender, nondistended, no abdominal masses GU: No CVA tenderness.  Skin: No rashes, bruises or suspicious lesions. Lymph: No cervical or inguinal adenopathy. Neurologic: Grossly intact, no focal deficits, moving all 4 extremities. Psychiatric: Normal mood and affect.  Laboratory Data: Lab Results  Component Value Date   WBC 8.8 09/02/2012   HGB 13.0 09/02/2012   HCT 37.3 09/02/2012   MCV 94 09/02/2012   PLT 274 09/02/2012    Lab Results  Component Value Date   CREATININE 0.44 (L) 09/02/2012    No results found for: PSA  No results found for: TESTOSTERONE  No results found for: HGBA1C  Urinalysis No results found for: COLORURINE, APPEARANCEUR, LABSPEC, PHURINE, GLUCOSEU, HGBUR, BILIRUBINUR, KETONESUR, PROTEINUR, UROBILINOGEN, NITRITE, LEUKOCYTESUR   Assessment & Plan:    1. Urethral prolapse I discussed with the patient urethral prolapse is a common finding an woman and does not require treatment unless symptoms are present. Currently, the patient reports no urinary symptoms, so I reassured her that no further workup or intervention is needed. She'll follow-up with [place] as needed if symptoms develop.   Return if symptoms worsen or fail to improve.  Nickie Retort, MD  [facility] [address] [place], [place] [address] [phone]

## 2017-08-22 ENCOUNTER — Ambulatory Visit (INDEPENDENT_AMBULATORY_CARE_PROVIDER_SITE_OTHER): Payer: Medicare Other | Admitting: Podiatry

## 2017-08-22 DIAGNOSIS — M79609 Pain in unspecified limb: Secondary | ICD-10-CM | POA: Diagnosis not present

## 2017-08-22 DIAGNOSIS — B351 Tinea unguium: Secondary | ICD-10-CM

## 2017-08-22 DIAGNOSIS — M21619 Bunion of unspecified foot: Secondary | ICD-10-CM

## 2017-08-22 DIAGNOSIS — L608 Other nail disorders: Secondary | ICD-10-CM

## 2017-08-22 NOTE — Progress Notes (Signed)
Complaint:  Visit Type: Patient returns to my office for continued preventative foot care services. Complaint: Patient states" my nails have grown long and thick and become painful to walk and wear shoes" The patient presents for preventative foot care services. No changes to ROS  Podiatric Exam: Vascular: dorsalis pedis and posterior tibial pulses are palpable bilateral. Capillary return is immediate. Temperature gradient is WNL. Skin turgor WNL  Sensorium: Normal Semmes Weinstein monofilament test. Normal tactile sensation bilaterally. Nail Exam: Pt has thick disfigured discolored nails with subungual debris noted bilateral entire nail hallux through fifth toenails Ulcer Exam: There is no evidence of ulcer or pre-ulcerative changes or infection. Orthopedic Exam: Muscle tone and strength are WNL. No limitations in general ROM. No crepitus or effusions noted. Severe HAV  B/L. Skin: No Porokeratosis. No infection or ulcers  Diagnosis:  Onychomycosis, , Pain in right toe, pain in left toes  Treatment & Plan Procedures and Treatment: Consent by patient was obtained for treatment procedures. The patient understood the discussion of treatment and procedures well. All questions were answered thoroughly reviewed. Debridement of mycotic and hypertrophic toenails, 1 through 5 bilateral and clearing of subungual debris. No ulceration, no infection noted.  Return Visit-Office Procedure: Patient instructed to return to the office for a follow up visit 3 months for continued evaluation and treatment.    Virgil Slinger DPM 

## 2017-09-07 ENCOUNTER — Other Ambulatory Visit: Payer: Self-pay | Admitting: Internal Medicine

## 2017-09-07 DIAGNOSIS — Z1231 Encounter for screening mammogram for malignant neoplasm of breast: Secondary | ICD-10-CM

## 2017-10-18 ENCOUNTER — Ambulatory Visit
Admission: RE | Admit: 2017-10-18 | Discharge: 2017-10-18 | Disposition: A | Discharge status: Home / Self Care | Payer: Medicare Other | Source: Ambulatory Visit | Attending: Internal Medicine | Admitting: Internal Medicine

## 2017-10-18 DIAGNOSIS — Z1231 Encounter for screening mammogram for malignant neoplasm of breast: Secondary | ICD-10-CM | POA: Diagnosis present

## 2017-11-21 ENCOUNTER — Ambulatory Visit (INDEPENDENT_AMBULATORY_CARE_PROVIDER_SITE_OTHER): Payer: Medicare Other | Admitting: Podiatry

## 2017-11-21 ENCOUNTER — Encounter: Payer: Self-pay | Admitting: Podiatry

## 2017-11-21 DIAGNOSIS — B351 Tinea unguium: Secondary | ICD-10-CM

## 2017-11-21 DIAGNOSIS — M21619 Bunion of unspecified foot: Secondary | ICD-10-CM

## 2017-11-21 DIAGNOSIS — L608 Other nail disorders: Secondary | ICD-10-CM

## 2017-11-21 DIAGNOSIS — M79609 Pain in unspecified limb: Secondary | ICD-10-CM

## 2017-11-21 NOTE — Progress Notes (Signed)
Complaint:  Visit Type: Patient returns to my office for continued preventative foot care services. Complaint: Patient states" my nails have grown long and thick and become painful to walk and wear shoes" The patient presents for preventative foot care services. No changes to ROS  Podiatric Exam: Vascular: dorsalis pedis and posterior tibial pulses are palpable bilateral. Capillary return is immediate. Temperature gradient is WNL. Skin turgor WNL  Sensorium: Normal Semmes Weinstein monofilament test. Normal tactile sensation bilaterally. Nail Exam: Pt has thick disfigured discolored nails with subungual debris noted bilateral entire nail hallux through fifth toenails Ulcer Exam: There is no evidence of ulcer or pre-ulcerative changes or infection. Orthopedic Exam: Muscle tone and strength are WNL. No limitations in general ROM. No crepitus or effusions noted. Severe HAV  B/L. Skin: No Porokeratosis. No infection or ulcers  Diagnosis:  Onychomycosis, , Pain in right toe, pain in left toes  Treatment & Plan Procedures and Treatment: Consent by patient was obtained for treatment procedures. The patient understood the discussion of treatment and procedures well. All questions were answered thoroughly reviewed. Debridement of mycotic and hypertrophic toenails, 1 through 5 bilateral and clearing of subungual debris. No ulceration, no infection noted.  Return Visit-Office Procedure: Patient instructed to return to the office for a follow up visit 3 months for continued evaluation and treatment.    Cleofas Hudgins DPM 

## 2018-02-23 ENCOUNTER — Ambulatory Visit (INDEPENDENT_AMBULATORY_CARE_PROVIDER_SITE_OTHER): Payer: Medicare Other | Admitting: Podiatry

## 2018-02-23 ENCOUNTER — Encounter: Payer: Self-pay | Admitting: Podiatry

## 2018-02-23 DIAGNOSIS — M79609 Pain in unspecified limb: Secondary | ICD-10-CM | POA: Diagnosis not present

## 2018-02-23 DIAGNOSIS — B351 Tinea unguium: Secondary | ICD-10-CM

## 2018-02-23 DIAGNOSIS — L608 Other nail disorders: Secondary | ICD-10-CM

## 2018-02-23 DIAGNOSIS — M21619 Bunion of unspecified foot: Secondary | ICD-10-CM

## 2018-02-23 NOTE — Progress Notes (Signed)
Complaint:  Visit Type: Patient returns to my office for continued preventative foot care services. Complaint: Patient states" my nails have grown long and thick and become painful to walk and wear shoes" The patient presents for preventative foot care services. No changes to ROS  Podiatric Exam: Vascular: dorsalis pedis and posterior tibial pulses are palpable bilateral. Capillary return is immediate. Temperature gradient is WNL. Skin turgor WNL  Sensorium: Normal Semmes Weinstein monofilament test. Normal tactile sensation bilaterally. Nail Exam: Pt has thick disfigured discolored nails with subungual debris noted bilateral entire nail hallux through fifth toenails Ulcer Exam: There is no evidence of ulcer or pre-ulcerative changes or infection. Orthopedic Exam: Muscle tone and strength are WNL. No limitations in general ROM. No crepitus or effusions noted. Severe HAV  B/L. Skin: No Porokeratosis. No infection or ulcers  Diagnosis:  Onychomycosis, , Pain in right toe, pain in left toes  Treatment & Plan Procedures and Treatment: Consent by patient was obtained for treatment procedures. The patient understood the discussion of treatment and procedures well. All questions were answered thoroughly reviewed. Debridement of mycotic and hypertrophic toenails, 1 through 5 bilateral and clearing of subungual debris. No ulceration, no infection noted. Padding provided for her bunion right foot. Return Visit-Office Procedure: Patient instructed to return to the office for a follow up visit 3 months for continued evaluation and treatment.    Gardiner Barefoot DPM

## 2018-05-29 ENCOUNTER — Encounter: Payer: Self-pay | Admitting: Podiatry

## 2018-05-29 ENCOUNTER — Ambulatory Visit (INDEPENDENT_AMBULATORY_CARE_PROVIDER_SITE_OTHER): Payer: Medicare Other | Admitting: Podiatry

## 2018-05-29 DIAGNOSIS — B351 Tinea unguium: Secondary | ICD-10-CM

## 2018-05-29 DIAGNOSIS — M79609 Pain in unspecified limb: Secondary | ICD-10-CM | POA: Diagnosis not present

## 2018-05-29 DIAGNOSIS — L608 Other nail disorders: Secondary | ICD-10-CM

## 2018-05-29 DIAGNOSIS — M21619 Bunion of unspecified foot: Secondary | ICD-10-CM

## 2018-05-29 NOTE — Progress Notes (Signed)
Complaint:  Visit Type: Patient returns to my office for continued preventative foot care services. Complaint: Patient states" my nails have grown long and thick and become painful to walk and wear shoes" The patient presents for preventative foot care services. No changes to ROS  Podiatric Exam: Vascular: dorsalis pedis and posterior tibial pulses are palpable bilateral. Capillary return is immediate. Temperature gradient is WNL. Skin turgor WNL  Sensorium: Normal Semmes Weinstein monofilament test. Normal tactile sensation bilaterally. Nail Exam: Pt has thick disfigured discolored nails with subungual debris noted bilateral entire nail hallux through fifth toenails Ulcer Exam: There is no evidence of ulcer or pre-ulcerative changes or infection. Orthopedic Exam: Muscle tone and strength are WNL. No limitations in general ROM. No crepitus or effusions noted. Severe HAV  B/L. Skin: No Porokeratosis. No infection or ulcers  Diagnosis:  Onychomycosis, , Pain in right toe, pain in left toes  Treatment & Plan Procedures and Treatment: Consent by patient was obtained for treatment procedures. The patient understood the discussion of treatment and procedures well. All questions were answered thoroughly reviewed. Debridement of mycotic and hypertrophic toenails, 1 through 5 bilateral and clearing of subungual debris. No ulceration, no infection noted.  Return Visit-Office Procedure: Patient instructed to return to the office for a follow up visit 3 months for continued evaluation and treatment.    Gardiner Barefoot DPM

## 2018-08-28 ENCOUNTER — Ambulatory Visit (INDEPENDENT_AMBULATORY_CARE_PROVIDER_SITE_OTHER): Payer: Medicare Other | Admitting: Podiatry

## 2018-08-28 ENCOUNTER — Encounter: Payer: Self-pay | Admitting: Podiatry

## 2018-08-28 DIAGNOSIS — B351 Tinea unguium: Secondary | ICD-10-CM | POA: Diagnosis not present

## 2018-08-28 DIAGNOSIS — M79609 Pain in unspecified limb: Secondary | ICD-10-CM

## 2018-08-28 DIAGNOSIS — M21619 Bunion of unspecified foot: Secondary | ICD-10-CM | POA: Diagnosis not present

## 2018-08-28 DIAGNOSIS — L608 Other nail disorders: Secondary | ICD-10-CM

## 2018-08-28 NOTE — Progress Notes (Signed)
Complaint:  Visit Type: Patient returns to my office for continued preventative foot care services. Complaint: Patient states" my nails have grown long and thick and become painful to walk and wear shoes" The patient presents for preventative foot care services. No changes to ROS  Podiatric Exam: Vascular: dorsalis pedis and posterior tibial pulses are palpable bilateral. Capillary return is immediate. Temperature gradient is WNL. Skin turgor WNL  Sensorium: Normal Semmes Weinstein monofilament test. Normal tactile sensation bilaterally. Nail Exam: Pt has thick disfigured discolored nails with subungual debris noted bilateral entire nail hallux through fifth toenails Ulcer Exam: There is no evidence of ulcer or pre-ulcerative changes or infection. Orthopedic Exam: Muscle tone and strength are WNL. No limitations in general ROM. No crepitus or effusions noted. Severe HAV  B/L. Skin: No Porokeratosis. No infection or ulcers  Diagnosis:  Onychomycosis, , Pain in right toe, pain in left toes  Treatment & Plan Procedures and Treatment: Consent by patient was obtained for treatment procedures. The patient understood the discussion of treatment and procedures well. All questions were answered thoroughly reviewed. Debridement of mycotic and hypertrophic toenails, 1 through 5 bilateral and clearing of subungual debris. No ulceration, no infection noted.  Return Visit-Office Procedure: Patient instructed to return to the office for a follow up visit 10 weeks. for continued evaluation and treatment.    Legend Pecore 

## 2018-09-12 ENCOUNTER — Other Ambulatory Visit: Payer: Self-pay | Admitting: Internal Medicine

## 2018-09-12 DIAGNOSIS — Z1231 Encounter for screening mammogram for malignant neoplasm of breast: Secondary | ICD-10-CM

## 2018-10-19 ENCOUNTER — Ambulatory Visit
Admission: RE | Admit: 2018-10-19 | Discharge: 2018-10-19 | Disposition: A | Discharge status: Home / Self Care | Payer: Medicare Other | Source: Ambulatory Visit | Attending: Internal Medicine | Admitting: Internal Medicine

## 2018-10-19 DIAGNOSIS — Z1231 Encounter for screening mammogram for malignant neoplasm of breast: Secondary | ICD-10-CM | POA: Insufficient documentation

## 2018-11-06 ENCOUNTER — Ambulatory Visit: Payer: Medicare Other | Admitting: Podiatry

## 2018-11-13 ENCOUNTER — Ambulatory Visit: Payer: Medicare Other | Admitting: Podiatry

## 2018-12-25 ENCOUNTER — Encounter: Payer: Self-pay | Admitting: Podiatry

## 2018-12-25 ENCOUNTER — Ambulatory Visit (INDEPENDENT_AMBULATORY_CARE_PROVIDER_SITE_OTHER): Payer: Medicare Other | Admitting: Podiatry

## 2018-12-25 DIAGNOSIS — M79676 Pain in unspecified toe(s): Secondary | ICD-10-CM | POA: Diagnosis not present

## 2018-12-25 DIAGNOSIS — L608 Other nail disorders: Secondary | ICD-10-CM

## 2018-12-25 DIAGNOSIS — B351 Tinea unguium: Secondary | ICD-10-CM | POA: Diagnosis not present

## 2018-12-25 DIAGNOSIS — M79609 Pain in unspecified limb: Principal | ICD-10-CM

## 2018-12-25 NOTE — Progress Notes (Signed)
Complaint:  Visit Type: Patient returns to my office for continued preventative foot care services. Complaint: Patient states" my nails have grown long and thick and become painful to walk and wear shoes" The patient presents for preventative foot care services. No changes to ROS  Podiatric Exam: Vascular: dorsalis pedis and posterior tibial pulses are palpable bilateral. Capillary return is immediate. Temperature gradient is WNL. Skin turgor WNL  Sensorium: Normal Semmes Weinstein monofilament test. Normal tactile sensation bilaterally. Nail Exam: Pt has thick disfigured discolored nails with subungual debris noted bilateral entire nail hallux through fifth toenails Ulcer Exam: There is no evidence of ulcer or pre-ulcerative changes or infection. Orthopedic Exam: Muscle tone and strength are WNL. No limitations in general ROM. No crepitus or effusions noted. Severe HAV  B/L. Skin: No Porokeratosis. No infection or ulcers  Diagnosis:  Onychomycosis, , Pain in right toe, pain in left toes  Treatment & Plan Procedures and Treatment: Consent by patient was obtained for treatment procedures. The patient understood the discussion of treatment and procedures well. All questions were answered thoroughly reviewed. Debridement of mycotic and hypertrophic toenails, 1 through 5 bilateral and clearing of subungual debris. No ulceration, no infection noted.  Return Visit-Office Procedure: Patient instructed to return to the office for a follow up visit 10 weeks. for continued evaluation and treatment.    Fountain Derusha 

## 2019-02-01 ENCOUNTER — Emergency Department: Payer: Medicare Other

## 2019-02-01 ENCOUNTER — Emergency Department
Admission: EM | Admit: 2019-02-01 | Discharge: 2019-02-01 | Disposition: A | Discharge status: Home / Self Care | Payer: Medicare Other | Attending: Emergency Medicine | Admitting: Emergency Medicine

## 2019-02-01 ENCOUNTER — Encounter: Payer: Self-pay | Admitting: Emergency Medicine

## 2019-02-01 ENCOUNTER — Other Ambulatory Visit: Payer: Self-pay

## 2019-02-01 DIAGNOSIS — R410 Disorientation, unspecified: Secondary | ICD-10-CM | POA: Insufficient documentation

## 2019-02-01 DIAGNOSIS — Z7982 Long term (current) use of aspirin: Secondary | ICD-10-CM | POA: Insufficient documentation

## 2019-02-01 DIAGNOSIS — R51 Headache: Secondary | ICD-10-CM | POA: Diagnosis present

## 2019-02-01 DIAGNOSIS — Z87891 Personal history of nicotine dependence: Secondary | ICD-10-CM | POA: Diagnosis not present

## 2019-02-01 DIAGNOSIS — H401130 Primary open-angle glaucoma, bilateral, stage unspecified: Secondary | ICD-10-CM | POA: Insufficient documentation

## 2019-02-01 DIAGNOSIS — I10 Essential (primary) hypertension: Secondary | ICD-10-CM

## 2019-02-01 DIAGNOSIS — Z79899 Other long term (current) drug therapy: Secondary | ICD-10-CM | POA: Insufficient documentation

## 2019-02-01 LAB — COMPREHENSIVE METABOLIC PANEL
ALBUMIN: 4.1 g/dL (ref 3.5–5.0)
ALT: 14 U/L (ref 0–44)
ANION GAP: 7 (ref 5–15)
AST: 16 U/L (ref 15–41)
Alkaline Phosphatase: 40 U/L (ref 38–126)
BUN: 12 mg/dL (ref 8–23)
CHLORIDE: 96 mmol/L — AB (ref 98–111)
CO2: 25 mmol/L (ref 22–32)
Calcium: 9 mg/dL (ref 8.9–10.3)
Creatinine, Ser: 0.49 mg/dL (ref 0.44–1.00)
GFR calc non Af Amer: >60 mL/min (ref >60)
Glucose, Bld: 133 mg/dL — ABNORMAL HIGH (ref 70–99)
Potassium: 4.2 mmol/L (ref 3.5–5.1)
SODIUM: 128 mmol/L — AB (ref 135–145)
Total Bilirubin: 0.8 mg/dL (ref 0.3–1.2)
Total Protein: 6.7 g/dL (ref 6.5–8.1)

## 2019-02-01 LAB — DIFFERENTIAL
Abs Immature Granulocytes: 0.02 10*3/uL (ref 0.00–0.07)
BASOS ABS: 0.1 10*3/uL (ref 0.0–0.1)
BASOS PCT: 1 %
EOS PCT: 1 %
Eosinophils Absolute: 0.1 10*3/uL (ref 0.0–0.5)
IMMATURE GRANULOCYTES: 0 %
Lymphocytes Relative: 19 %
Lymphs Abs: 1.2 10*3/uL (ref 0.7–4.0)
MONO ABS: 0.6 10*3/uL (ref 0.1–1.0)
Monocytes Relative: 9 %
NEUTROS ABS: 4.2 10*3/uL (ref 1.7–7.7)
NEUTROS PCT: 70 %

## 2019-02-01 LAB — CBC
HEMATOCRIT: 38.3 % (ref 36.0–46.0)
HEMOGLOBIN: 13.2 g/dL (ref 12.0–15.0)
MCH: 32.4 pg (ref 26.0–34.0)
MCHC: 34.5 g/dL (ref 30.0–36.0)
MCV: 94.1 fL (ref 80.0–100.0)
Platelets: 256 10*3/uL (ref 150–400)
RBC: 4.07 MIL/uL (ref 3.87–5.11)
RDW: 11.9 % (ref 11.5–15.5)
WBC: 6.1 10*3/uL (ref 4.0–10.5)
nRBC: 0 % (ref 0.0–0.2)

## 2019-02-01 LAB — URINALYSIS, COMPLETE (UACMP) WITH MICROSCOPIC
BACTERIA UA: NONE SEEN
BILIRUBIN URINE: NEGATIVE
Glucose, UA: 50 mg/dL — AB
HGB URINE DIPSTICK: NEGATIVE
KETONES UR: NEGATIVE mg/dL
LEUKOCYTE UA: NEGATIVE
NITRITE: NEGATIVE
PROTEIN: NEGATIVE mg/dL
Specific Gravity, Urine: 1.006 (ref 1.005–1.030)
Squamous Epithelial / LPF: NONE SEEN (ref 0–5)
pH: 8 (ref 5.0–8.0)

## 2019-02-01 LAB — APTT: APTT: 36 s (ref 24–36)

## 2019-02-01 LAB — PROTIME-INR
INR: 1 (ref 0.8–1.2)
Prothrombin Time: 13 seconds (ref 11.4–15.2)

## 2019-02-01 MED ORDER — TETRACAINE HCL 0.5 % OP SOLN
1.0000 [drp] | Freq: Once | OPHTHALMIC | Status: AC
  Administered 2019-02-01: 1 [drp] via OPHTHALMIC
  Filled 2019-02-01: qty 4

## 2019-02-01 MED ORDER — TROPICAMIDE 0.5 % OP SOLN: 1.0000 [drp] | Freq: Once | OPHTHALMIC | Status: DC

## 2019-02-01 NOTE — ED Notes (Signed)
Patient updated on plan of care and apologized for delays.

## 2019-02-01 NOTE — ED Provider Notes (Signed)
[facility] Emergency Department Provider Note   ____________________________________________   First MD Initiated Contact with Patient 02/01/19 1927     (approximate)  I have reviewed the triage vital signs and the nursing notes.   HISTORY  Chief Complaint Dizziness; Eye Problem; and Headache    HPI Rashae Donne Robillard is a 85 y.o. female presenting for evaluation of feeling fuzzy, having a headache, and generally not feeling well for approximately 3 to 4 days  Denies any weakness in arm or leg.  No nausea vomiting.  Reports just a strange feeling not really a "headache" but reports that this feels like an odd feeling across her forehead.  Did not come on suddenly.  Is been fairly steady for the last 3 to 4 days.  Family reports she has been behaving and acting normally.  She has had no chest pain or shortness of breath.  No fevers or chills.  No pain or discomfort with urination.  Reports she does not really sure describe just feels a little bit "fuzzy".  Knee pain.   Past Medical History:  Diagnosis Date  . Cancer ([place])    melanoma on knee  . Glaucoma     Patient Active Problem List   Diagnosis Date Noted  . Atrophic vaginitis 02/10/2015  . Glaucoma 02/10/2015  . HLD (hyperlipidemia) 02/10/2015  . Hypoglycemia 02/10/2015  . Malignant melanoma of knee ([place]) 02/10/2015  . Acne erythematosa 02/10/2015  . Thyroid nodule 02/10/2015  . Below normal amount of sodium in the blood 02/10/2015  . Abnormal blood sugar 10/28/2014  . Abnormal glucose level 10/28/2014  . Acute infection of nasal sinus 07/19/2014  . Female genuine stress incontinence 05/01/2014  . Prolapse of urethra 05/01/2014  . Urge incontinence of urine 05/01/2014    Past Surgical History:  Procedure Laterality Date  . ABDOMINAL HYSTERECTOMY    . BREAST BIOPSY Right   . CYSTECTOMY    . RECTAL PROLAPSE REPAIR    . VAGINAL PROLAPSE REPAIR      Prior to Admission medications     Medication Sig Start Date End Date Taking? Authorizing Provider  aspirin EC 81 MG tablet Take 81 mg by mouth.    [provider]  bimatoprost (LUMIGAN) 0.01 % SOLN Use one drop in both eyes at bed time 03/15/14   [provider]  ESTRACE VAGINAL 0.1 MG/GM vaginal cream USE 1 GRAM VAGINALLY TWO TIMES A WEEK AND APPLY TOPICALLY NIGHTLY TO URETHRAL MEATUS 11/01/16   Defrancesco, Alanda Slim, MD  hydrocortisone 2.5 % ointment  09/09/17   [provider]  ketorolac (ACULAR) 0.4 % SOLN  08/05/17   [provider]  timolol (TIMOPTIC) 0.5 % ophthalmic solution  01/11/15   [provider]  triamcinolone ointment (KENALOG) 0.1 %  09/09/17   [provider]    Allergies Amoxicillin; Brinzolamide; and Other  Family History  Problem Relation Age of Onset  . Breast cancer Sister   . Bladder Cancer Neg Hx   . Kidney cancer Neg Hx     Social History Social History   Tobacco Use  . Smoking status: Former Research scientist (life sciences)  . Smokeless tobacco: Never Used  Substance Use Topics  . Alcohol use: Not on file  . Drug use: Not on file    Review of Systems Constitutional: No fever/chills just feels a bit more fatigue Eyes: No visual changes.  Denies eye pain.  She reported to triage that she had vision difficulty, but reports her left  eye vision has been chronically worse than the right and has not noticed any sudden changes ENT: No sore throat. Cardiovascular: Denies chest pain. Respiratory: Denies shortness of breath. Gastrointestinal: No abdominal pain.   Genitourinary: Negative for dysuria. Musculoskeletal: Negative for back pain. Skin: Negative for rash. Neurological: Negative for headaches, areas of focal weakness or numbness.  She reports just a "fuzzy" type feeling.  Does feel little bit pressure behind her eyes but cannot describe it well.    ____________________________________________   PHYSICAL EXAM:  VITAL SIGNS: ED Triage Vitals  Enc Vitals  Group     BP 02/01/19 1420 (!) 182/75     Pulse Rate 02/01/19 1420 73     Resp 02/01/19 1420 16     Temp 02/01/19 1420 97.6 F (36.4 C)     Temp Source 02/01/19 1420 Oral     SpO2 02/01/19 1420 98 %     Weight 02/01/19 1421 112 lb (50.8 kg)     Height 02/01/19 1421 5\' 1"  (1.549 m)     Head Circumference --      Peak Flow --      Pain Score 02/01/19 1421 5     Pain Loc --      Pain Edu? --      Excl. in [place]? --     Constitutional: Alert and oriented. Well appearing and in no acute distress.  She and family at the bedside very pleasant. Eyes: Conjunctivae are normal.  Intraocular pressures measured Head: Atraumatic. Nose: No congestion/rhinnorhea. Mouth/Throat: Mucous membranes are moist. Neck: No stridor.  Cardiovascular: Normal rate, regular rhythm. Grossly normal heart sounds.  Good peripheral circulation. Respiratory: Normal respiratory effort.  No retractions. Lungs CTAB. Gastrointestinal: Soft and nontender. No distention. Musculoskeletal: No lower extremity tenderness nor edema. Neurologic:  Normal speech and language. No gross focal neurologic deficits are appreciated except for some very slight pronator drift involving the right lower leg.  Normal smile and cranial nerve exam.  Some's very subtle ataxia of the upper extremities bilateral, but the patient reports this is normal for her.  She has full strength in both upper extremities.  Normal strength involving the left lower and right lower extremity except there is some slight pronator drift in the right greater than the left.  Patient does not know if this is chronic.  Denies noticing however that she had any acute right leg weakness. Skin:  Skin is warm, dry and intact. No rash noted. Psychiatric: Mood and affect are normal. Speech and behavior are normal.  ____________________________________________   LABS (all labs ordered are listed, but only abnormal results are displayed)  Labs Reviewed  COMPREHENSIVE METABOLIC  PANEL - Abnormal; Notable for the following components:      Result Value   Sodium 128 (*)    Chloride 96 (*)    Glucose, Bld 133 (*)    All other components within normal limits  URINALYSIS, COMPLETE (UACMP) WITH MICROSCOPIC - Abnormal; Notable for the following components:   Color, Urine STRAW (*)    APPearance CLEAR (*)    Glucose, UA 50 (*)    All other components within normal limits  PROTIME-INR  APTT  CBC  DIFFERENTIAL   ____________________________________________  EKG  Reviewed and interpreted by me at 1430 Heart rate 70 QRS 99 QTc 430 Normal sinus rhythm, some T wave abnormalities are observable especially inversions in the lateral precordial leads, but compared with previous this is unchanged ____________________________________________  RADIOLOGY  No results found.  MRI  of the brain is pending at time of signout ____________________________________________   PROCEDURES  Procedure(s) performed: None  Procedures  Critical Care performed: No  ____________________________________________   INITIAL IMPRESSION / ASSESSMENT AND PLAN / ED COURSE  Pertinent labs & imaging results that were available during my care of the patient were reviewed by me and considered in my medical decision making (see chart for details).   Pressure or odd feeling across the forehead.  Somewhat bilateral in nature, little bit of a strange feeling she reports around the base of the left occiput as well.  Very reassuring exam except for possibly some slight pronator drift involving the right lower leg.  She denies any acute visual symptoms.  She is noted to be hypertensive, question if this is secondary to another process such as possible mild ischemic stroke or if this could be a precipitating factor in her symptomatology today.  She denies any acute cardiac or pulmonary symptoms.  No "pain".  Denies there is a headache.  Very reassuring clinical examination except for the very subtle  neurologic finding which I am unclear if this is an acute or chronic slight weakness in the right leg it is almost unnoticeable.  Clinical Course as of Feb 29 2055  Thu Feb 01, 2019  2010 Hyponatremia noted, appears to be chronic without a sudden acute change.    [MQ]  2037 R eye IOP 20, 18 L eye IOP 30, 28, 35     [MQ]    Clinical Course User Index [MQ] Delman Kitten, MD   IOPs discussed with Dr. Neville Route; he advises that she would need a clinic visit tomorrow but no emergent evaluation. He doesn't think the pressure feeling is glaucoma. Patient denies eye pain.   ----------------------------------------- 9:20 PM on 02/01/2019 -----------------------------------------  Discussed intraocular pressures with Dr. Neville Route [facility]).  Patient is resting comfortably.  Denies any visual changes, denies loss of vision, she is able to read text well at 3 feet.  Denies eye pain.  I suspect this is unlikely symptomatology related.  We will proceed with MRI of the brain to exclude stroke as she has some subtle slight weakness in the right lower leg it is unclear as to chronicity.  The patient is certainly not an obvious stroke, she is not a TPA candidate symptoms started on her on Monday.  She denies any obvious acute stroke symptoms.  However, her notable hypertension could be secondary to an ischemic event, for potentially could be responsible for some of her pressure-like feeling.  Discussed with Dr. Kerman Passey, follow-up on MRI of the brain and if negative recheck blood pressure consideration for antihypertensive if still notably elevated ____________________________________________   FINAL CLINICAL IMPRESSION(S) / ED DIAGNOSES  Final diagnoses:  Hypertension, unspecified type  Primary open angle glaucoma of both eyes, unspecified glaucoma stage        Note:  This document was prepared using Dragon voice recognition software and may include unintentional dictation errors    Delman Kitten, MD 02/03/19 2055

## 2019-02-01 NOTE — ED Triage Notes (Signed)
Having episodes of irritation right posterior neck, vision blurred and dizzy.  Says head felt fuzzy after.  First episode on Tuesday and yeaterday once and today around 10am. Says she still feels sick in her head, no headache.  But then says she does have some pressure in head--behind eyes.  Some nausea.

## 2019-02-01 NOTE — ED Provider Notes (Signed)
-----------------------------------------   11:13 PM on 02/01/2019 -----------------------------------------  Patient's work-up is resulted showing a negative MRI.  Urinalysis appears normal as well.  No acute abnormalities identified.  Overall the patient appears well continues to be moderately hypertensive however patient will follow-up with her doctor.  I discussed the findings with the patient she is agreeable to plan to discharge home and PCP follow-up.   Harvest Dark, MD 02/01/19 [phone]

## 2019-02-01 NOTE — ED Triage Notes (Signed)
First Nurse Note:  Patient presents to the ED for intermittent headache, blurry vision and dizziness.  Patient states last episode began around 10am.  Patient denies any vision problems or headache at this time.  Patient states at this time she has some generalized weakness and feels, "foggy headed".  Patient's grip strength is equal, smile is equal and she has equal sensation.

## 2019-02-01 NOTE — Discharge Instructions (Signed)
° °  Follow-up with Dr. Neville Route ton 02/02/19 for a close eye exam (this is very important). Your left eye is showing elevated pressures, but we don't think this is the cause of your symptoms necessarily. It does, however, require close input.

## 2019-02-06 DIAGNOSIS — I10 Essential (primary) hypertension: Secondary | ICD-10-CM | POA: Insufficient documentation

## 2019-02-08 ENCOUNTER — Other Ambulatory Visit: Payer: Self-pay

## 2019-02-08 ENCOUNTER — Encounter: Payer: Self-pay | Admitting: Emergency Medicine

## 2019-02-08 ENCOUNTER — Inpatient Hospital Stay: Payer: Medicare Other

## 2019-02-08 ENCOUNTER — Emergency Department: Payer: Medicare Other

## 2019-02-08 ENCOUNTER — Observation Stay
Admission: EM | Admit: 2019-02-08 | Discharge: 2019-02-09 | Disposition: A | Discharge status: Home / Self Care | Payer: Medicare Other | Attending: Internal Medicine | Admitting: Internal Medicine

## 2019-02-08 DIAGNOSIS — Z7982 Long term (current) use of aspirin: Secondary | ICD-10-CM | POA: Insufficient documentation

## 2019-02-08 DIAGNOSIS — M6281 Muscle weakness (generalized): Secondary | ICD-10-CM | POA: Insufficient documentation

## 2019-02-08 DIAGNOSIS — J984 Other disorders of lung: Secondary | ICD-10-CM | POA: Diagnosis not present

## 2019-02-08 DIAGNOSIS — E871 Hypo-osmolality and hyponatremia: Principal | ICD-10-CM | POA: Diagnosis present

## 2019-02-08 DIAGNOSIS — Z87891 Personal history of nicotine dependence: Secondary | ICD-10-CM | POA: Diagnosis not present

## 2019-02-08 DIAGNOSIS — H409 Unspecified glaucoma: Secondary | ICD-10-CM | POA: Diagnosis not present

## 2019-02-08 DIAGNOSIS — Z79899 Other long term (current) drug therapy: Secondary | ICD-10-CM | POA: Insufficient documentation

## 2019-02-08 DIAGNOSIS — Z881 Allergy status to other antibiotic agents status: Secondary | ICD-10-CM | POA: Insufficient documentation

## 2019-02-08 DIAGNOSIS — R531 Weakness: Secondary | ICD-10-CM

## 2019-02-08 DIAGNOSIS — R739 Hyperglycemia, unspecified: Secondary | ICD-10-CM | POA: Insufficient documentation

## 2019-02-08 DIAGNOSIS — I10 Essential (primary) hypertension: Secondary | ICD-10-CM | POA: Diagnosis not present

## 2019-02-08 DIAGNOSIS — Z888 Allergy status to other drugs, medicaments and biological substances status: Secondary | ICD-10-CM | POA: Insufficient documentation

## 2019-02-08 LAB — BASIC METABOLIC PANEL
Anion gap: 7 (ref 5–15)
BUN: 7 mg/dL — AB (ref 8–23)
CO2: 24 mmol/L (ref 22–32)
Calcium: 8.2 mg/dL — ABNORMAL LOW (ref 8.9–10.3)
Chloride: 89 mmol/L — ABNORMAL LOW (ref 98–111)
Creatinine, Ser: 0.32 mg/dL — ABNORMAL LOW (ref 0.44–1.00)
GFR calc Af Amer: >60 mL/min (ref >60)
GFR calc non Af Amer: >60 mL/min (ref >60)
Glucose, Bld: 209 mg/dL — ABNORMAL HIGH (ref 70–99)
Potassium: 4.1 mmol/L (ref 3.5–5.1)
Sodium: 120 mmol/L — ABNORMAL LOW (ref 135–145)

## 2019-02-08 LAB — URINALYSIS, COMPLETE (UACMP) WITH MICROSCOPIC
BACTERIA UA: NONE SEEN
Bilirubin Urine: NEGATIVE
Glucose, UA: 150 mg/dL — AB
Hgb urine dipstick: NEGATIVE
Ketones, ur: NEGATIVE mg/dL
Leukocytes,Ua: NEGATIVE
Nitrite: NEGATIVE
Protein, ur: NEGATIVE mg/dL
Specific Gravity, Urine: 1.006 (ref 1.005–1.030)
WBC UA: NONE SEEN WBC/hpf (ref 0–5)
pH: 7 (ref 5.0–8.0)

## 2019-02-08 LAB — CBC
HCT: 37.5 % (ref 36.0–46.0)
HEMOGLOBIN: 13 g/dL (ref 12.0–15.0)
MCH: 32.3 pg (ref 26.0–34.0)
MCHC: 34.7 g/dL (ref 30.0–36.0)
MCV: 93.1 fL (ref 80.0–100.0)
Platelets: 322 10*3/uL (ref 150–400)
RBC: 4.03 MIL/uL (ref 3.87–5.11)
RDW: 11.7 % (ref 11.5–15.5)
WBC: 7.9 10*3/uL (ref 4.0–10.5)
nRBC: 0 % (ref 0.0–0.2)

## 2019-02-08 LAB — HEPATIC FUNCTION PANEL
ALBUMIN: 4 g/dL (ref 3.5–5.0)
ALT: 18 U/L (ref 0–44)
AST: 20 U/L (ref 15–41)
Alkaline Phosphatase: 43 U/L (ref 38–126)
BILIRUBIN TOTAL: 0.5 mg/dL (ref 0.3–1.2)
Bilirubin, Direct: <0.1 mg/dL (ref 0.0–0.2)
Total Protein: 6.4 g/dL — ABNORMAL LOW (ref 6.5–8.1)

## 2019-02-08 LAB — OSMOLALITY: OSMOLALITY: 265 mosm/kg — AB (ref 275–295)

## 2019-02-08 MED ORDER — ONDANSETRON HCL 4 MG PO TABS: 4.0000 mg | ORAL_TABLET | Freq: Four times a day (QID) | ORAL | Status: DC | PRN

## 2019-02-08 MED ORDER — SODIUM CHLORIDE 0.9% FLUSH: 3.0000 mL | Freq: Once | INTRAVENOUS | Status: DC

## 2019-02-08 MED ORDER — LATANOPROST 0.005 % OP SOLN
1.0000 [drp] | Freq: Every day | OPHTHALMIC | Status: DC
  Administered 2019-02-08: 1 [drp] via OPHTHALMIC
  Filled 2019-02-08: qty 2.5

## 2019-02-08 MED ORDER — ONDANSETRON HCL 4 MG/2ML IJ SOLN: 4.0000 mg | Freq: Four times a day (QID) | INTRAMUSCULAR | Status: DC | PRN

## 2019-02-08 MED ORDER — POLYETHYLENE GLYCOL 3350 17 G PO PACK
17.0000 g | PACK | Freq: Every day | ORAL | Status: DC | PRN
  Administered 2019-02-09: 17 g via ORAL
  Filled 2019-02-08: qty 1

## 2019-02-08 MED ORDER — ACETAMINOPHEN 650 MG RE SUPP: 650.0000 mg | Freq: Four times a day (QID) | RECTAL | Status: DC | PRN

## 2019-02-08 MED ORDER — SODIUM CHLORIDE 0.9 % IV SOLN
INTRAVENOUS | Status: DC
  Administered 2019-02-08: 22:00:00 via INTRAVENOUS

## 2019-02-08 MED ORDER — PROPRANOLOL HCL ER 60 MG PO CP24
60.0000 mg | ORAL_CAPSULE | Freq: Every day | ORAL | Status: DC
  Administered 2019-02-09: 10:00:00 60 mg via ORAL
  Filled 2019-02-08: qty 1

## 2019-02-08 MED ORDER — ASPIRIN EC 81 MG PO TBEC
81.0000 mg | DELAYED_RELEASE_TABLET | Freq: Every day | ORAL | Status: DC
  Administered 2019-02-09: 10:00:00 81 mg via ORAL
  Filled 2019-02-08: qty 1

## 2019-02-08 MED ORDER — HYDRALAZINE HCL 20 MG/ML IJ SOLN: 5.0000 mg | INTRAMUSCULAR | Status: DC | PRN

## 2019-02-08 MED ORDER — ACETAMINOPHEN 325 MG PO TABS: 650.0000 mg | ORAL_TABLET | Freq: Four times a day (QID) | ORAL | Status: DC | PRN

## 2019-02-08 MED ORDER — IOHEXOL 300 MG/ML  SOLN
75.0000 mL | Freq: Once | INTRAMUSCULAR | Status: AC | PRN
  Administered 2019-02-08: 75 mL via INTRAVENOUS

## 2019-02-08 MED ORDER — SODIUM CHLORIDE 0.9 % IV BOLUS
500.0000 mL | Freq: Once | INTRAVENOUS | Status: AC
  Administered 2019-02-08: 500 mL via INTRAVENOUS

## 2019-02-08 MED ORDER — ENOXAPARIN SODIUM 40 MG/0.4ML ~~LOC~~ SOLN: 40.0000 mg | SUBCUTANEOUS | Status: DC

## 2019-02-08 NOTE — ED Notes (Signed)
Patient transported to X-ray 

## 2019-02-08 NOTE — ED Notes (Signed)
Stephen RN, aware of bed assigned  

## 2019-02-08 NOTE — Progress Notes (Signed)
Family Meeting Note  Advance Directive:yes  Today a meeting took place with the Patient and daughters.  Patient is able to participate.   The following clinical team members were present during this meeting:MD  The following were discussed:Patient's diagnosis: hyponatremia, Patient's progosis: Unable to determine and Goals for treatment: Full Code  Additional follow-up to be provided: prn  Time spent during discussion:20 minutes  Evette Doffing, MD

## 2019-02-08 NOTE — ED Triage Notes (Signed)
Pt c/o being weak and "shaky" since this am. Family states pt was here last week with same symptoms and has low sodium per family. PT has not been at baseline x1wk. VSS. PT A&OX4

## 2019-02-08 NOTE — ED Provider Notes (Addendum)
[facility] Emergency Department Provider Note    First MD Initiated Contact with Patient 02/08/19 1551     (approximate)  I have reviewed the triage vital signs and the nursing notes.   HISTORY  Chief Complaint Weakness    HPI Cathy Newman is a 85 y.o. female the below listed past medical history presents the ER for 1 week of generalized weakness malaise and "sick feeling ".  Patient was seen in the ER several days ago the initiation his symptoms had extensive work-up which was reassuring she was sent for outpatient follow-up.  Over the past week she is had downtrending sodium.  She is had normal oral intake.  Just feels generalized weakness.  Did not have any new medication changes.  She is not on a diuretic.  Apparently according to family she is had a good appetite.  No nausea vomiting or diarrhea.  Does have remote history of smoking.    Past Medical History:  Diagnosis Date  . Cancer ([place])    melanoma on knee  . Glaucoma    Family History  Problem Relation Age of Onset  . Breast cancer Sister   . Bladder Cancer Neg Hx   . Kidney cancer Neg Hx    Past Surgical History:  Procedure Laterality Date  . ABDOMINAL HYSTERECTOMY    . BREAST BIOPSY Right   . CYSTECTOMY    . RECTAL PROLAPSE REPAIR    . VAGINAL PROLAPSE REPAIR     Patient Active Problem List   Diagnosis Date Noted  . Atrophic vaginitis 02/10/2015  . Glaucoma 02/10/2015  . HLD (hyperlipidemia) 02/10/2015  . Hypoglycemia 02/10/2015  . Malignant melanoma of knee ([place]) 02/10/2015  . Acne erythematosa 02/10/2015  . Thyroid nodule 02/10/2015  . Below normal amount of sodium in the blood 02/10/2015  . Abnormal blood sugar 10/28/2014  . Abnormal glucose level 10/28/2014  . Acute infection of nasal sinus 07/19/2014  . Female genuine stress incontinence 05/01/2014  . Prolapse of urethra 05/01/2014  . Urge incontinence of urine 05/01/2014      Prior to Admission medications     Medication Sig Start Date End Date Taking? Authorizing Provider  aspirin EC 81 MG tablet Take 81 mg by mouth daily.    Yes [provider]  bimatoprost (LUMIGAN) 0.01 % SOLN Place 1 drop into both eyes at bedtime.    Yes [provider]  ESTRACE VAGINAL 0.1 MG/GM vaginal cream USE 1 GRAM VAGINALLY TWO TIMES A WEEK AND APPLY TOPICALLY NIGHTLY TO URETHRAL MEATUS Patient taking differently: Place 1 Applicatorful vaginally 2 (two) times a week. Apply nightly to urethral meatus 11/01/16  Yes Defrancesco, Alanda Slim, MD  propranolol ER (INDERAL LA) 60 MG 24 hr capsule Take 60 mg by mouth daily. 02/06/19  Yes [provider]  timolol (TIMOPTIC) 0.5 % ophthalmic solution Place 1 drop into both eyes 2 (two) times daily.     [provider]    Allergies Amoxicillin; Brinzolamide; and Other    Social History Social History   Tobacco Use  . Smoking status: Former Research scientist (life sciences)  . Smokeless tobacco: Never Used  Substance Use Topics  . Alcohol use: Not on file  . Drug use: Not on file    Review of Systems Patient denies headaches, rhinorrhea, blurry vision, numbness, shortness of breath, chest pain, edema, cough, abdominal pain, nausea, vomiting, diarrhea, dysuria, fevers, rashes or hallucinations unless otherwise stated above in HPI. ____________________________________________   PHYSICAL EXAM:  VITAL  SIGNS: Vitals:   02/08/19 1341  BP: (!) 174/68  Pulse: 69  Resp: 16  Temp: 97.6 F (36.4 C)  SpO2: 99%    Constitutional: Alert and oriented. Appears much younger than stated age Eyes: Conjunctivae are normal.  Head: Atraumatic. Nose: No congestion/rhinnorhea. Mouth/Throat: Mucous membranes are moist.   Neck: No stridor. Painless ROM.  Cardiovascular: Normal rate, regular rhythm. Grossly normal heart sounds.  Good peripheral circulation. Respiratory: Normal respiratory effort.  No retractions. Lungs CTAB. Gastrointestinal: Soft and nontender. No  distention. No abdominal bruits. No CVA tenderness. Genitourinary:  Musculoskeletal: No lower extremity tenderness nor edema.  No joint effusions. Neurologic:  Normal speech and language. No gross focal neurologic deficits are appreciated. No facial droop Skin:  Skin is warm, dry and intact. No rash noted. Psychiatric: Mood and affect are normal. Speech and behavior are normal.  ____________________________________________   LABS (all labs ordered are listed, but only abnormal results are displayed)  Results for orders placed or performed during the hospital encounter of 02/08/19 (from the past 24 hour(s))  Basic metabolic panel     Status: Abnormal   Collection Time: 02/08/19  1:43 PM  Result Value Ref Range   Sodium 120 (L) 135 - 145 mmol/L   Potassium 4.1 3.5 - 5.1 mmol/L   Chloride 89 (L) 98 - 111 mmol/L   CO2 24 22 - 32 mmol/L   Glucose, Bld 209 (H) 70 - 99 mg/dL   BUN 7 (L) 8 - 23 mg/dL   Creatinine, Ser 0.32 (L) 0.44 - 1.00 mg/dL   Calcium 8.2 (L) 8.9 - 10.3 mg/dL   GFR calc non Af Amer >60 >60 mL/min   GFR calc Af Amer >60 >60 mL/min   Anion gap 7 5 - 15  CBC     Status: None   Collection Time: 02/08/19  1:43 PM  Result Value Ref Range   WBC 7.9 4.0 - 10.5 K/uL   RBC 4.03 3.87 - 5.11 MIL/uL   Hemoglobin 13.0 12.0 - 15.0 g/dL   HCT 37.5 36.0 - 46.0 %   MCV 93.1 80.0 - 100.0 fL   MCH 32.3 26.0 - 34.0 pg   MCHC 34.7 30.0 - 36.0 g/dL   RDW 11.7 11.5 - 15.5 %   Platelets 322 150 - 400 K/uL   nRBC 0.0 0.0 - 0.2 %  Urinalysis, Complete w Microscopic     Status: Abnormal   Collection Time: 02/08/19  4:04 PM  Result Value Ref Range   Color, Urine STRAW (A) YELLOW   APPearance CLEAR (A) CLEAR   Specific Gravity, Urine 1.006 1.005 - 1.030   pH 7.0 5.0 - 8.0   Glucose, UA 150 (A) NEGATIVE mg/dL   Hgb urine dipstick NEGATIVE NEGATIVE   Bilirubin Urine NEGATIVE NEGATIVE   Ketones, ur NEGATIVE NEGATIVE mg/dL   Protein, ur NEGATIVE NEGATIVE mg/dL   Nitrite NEGATIVE  NEGATIVE   Leukocytes,Ua NEGATIVE NEGATIVE   RBC / HPF 0-5 0 - 5 RBC/hpf   WBC, UA NONE SEEN 0 - 5 WBC/hpf   Bacteria, UA NONE SEEN NONE SEEN   Squamous Epithelial / LPF 0-5 0 - 5   ____________________________________________  EKG My review and personal interpretation at Time: 13:48   Indication: weakness  Rate: 70  Rhythm: sinus Axis:  normal Other:  normal intervals, nonsepcific st and t [id number] abn ____________________________________________  RADIOLOGY  I personally reviewed all radiographic images ordered to evaluate for the above acute complaints and reviewed radiology reports and  findings.  These findings were personally discussed with the patient.  Please see medical record for radiology report.  ____________________________________________   PROCEDURES  Procedure(s) performed:  .Critical Care Performed by: Merlyn Lot, MD Authorized by: Merlyn Lot, MD   Critical care provider statement:    Critical care time (minutes):  30   Critical care time was exclusive of:  Separately billable procedures and treating other patients   Critical care was necessary to treat or prevent imminent or life-threatening deterioration of the following conditions:  Metabolic crisis   Critical care was time spent personally by me on the following activities:  Development of treatment plan with patient or surrogate, discussions with consultants, evaluation of patient's response to treatment, examination of patient, obtaining history from patient or surrogate, ordering and performing treatments and interventions, ordering and review of laboratory studies, ordering and review of radiographic studies, pulse oximetry, re-evaluation of patient's condition and review of old charts      Critical Care performed: yes ____________________________________________   INITIAL IMPRESSION / ASSESSMENT AND PLAN / ED COURSE  Pertinent labs & imaging results that were available during my care of  the patient were reviewed by me and considered in my medical decision making (see chart for details).   DDX: Dehydration, sepsis, pna, uti, hypoglycemia, cva, drug effect, withdrawal, encephalitis   Cathy Newman is a 84 y.o. who presents to the ED with symptoms as described above.  Patient with evidence of downtrending acute hyponatremia.  I do suspect that is contributing her symptoms.  Uncertain etiology.  Blood work with several further blood differential.  Her abdominal exam is soft and benign.  We will provide IV fluids.  Clinical Course as of Feb 08 1703  Thu Feb 08, 2019  1652 X-ray is reassuring.  Still pending LFTs but based on her worsening hyponatremia patient will require hospitalization for further medical evaluation.   [PR]    Clinical Course User Index [PR] Merlyn Lot, MD     As part of my medical decision making, I reviewed the following data within the [place] notes reviewed and incorporated, Labs reviewed, notes from prior ED visits and [place] Controlled Substance Database   ____________________________________________   FINAL CLINICAL IMPRESSION(S) / ED DIAGNOSES  Final diagnoses:  Hyponatremia  Weakness      NEW MEDICATIONS STARTED DURING THIS VISIT:  New Prescriptions   No medications on file     Note:  This document was prepared using Dragon voice recognition software and may include unintentional dictation errors.    Merlyn Lot, MD 02/08/19 1704    Merlyn Lot, MD 02/16/19 [phone]

## 2019-02-08 NOTE — ED Triage Notes (Signed)
Brought for hypertension.  Daughter has bps written down.  On new med from Dr. Doy Hutching, but they could not see her today.

## 2019-02-08 NOTE — ED Notes (Signed)
ED TO INPATIENT HANDOFF REPORT  ED Nurse Name and Phone #: Annie Main [phone]  S Name/Age/Gender Cathy Newman 85 y.o. female Room/Bed: ED03A/ED03A  Code Status   Code Status: Not on file  Home/SNF/Other Home Patient oriented to: self, place, time and situation Is this baseline? Yes   Triage Complete: Triage complete  Chief Complaint bp high  Triage Note Brought for hypertension.  Daughter has bps written down.  On new med from Dr. Doy Hutching, but they could not see her today.  Pt c/o being weak and "shaky" since this am. Family states pt was here last week with same symptoms and has low sodium per family. PT has not been at baseline x1wk. VSS. PT A&OX4    Allergies Allergies  Allergen Reactions  . Amoxicillin Hives  . Brinzolamide Other (See Comments)  . Other Other (See Comments)    MYCINS MYCINS    Level of Care/Admitting Diagnosis ED Disposition    ED Disposition Condition [place]: [place] [[id number]]  Level of Care: Med-Surg [16]  Diagnosis: Acute hyponatremia [017510]  Admitting Physician: Hyman Bible DODD [[id number]]  Attending Physician: Hyman Bible DODD [[id number]]  Estimated length of stay: past midnight tomorrow  Certification:: I certify this patient will need inpatient services for at least 2 midnights  PT Class (Do Not Modify): Inpatient [101]  PT Acc Code (Do Not Modify): Private [1]       B Medical/Surgery History Past Medical History:  Diagnosis Date  . Cancer ([place])    melanoma on knee  . Glaucoma    Past Surgical History:  Procedure Laterality Date  . ABDOMINAL HYSTERECTOMY    . BREAST BIOPSY Right   . CYSTECTOMY    . RECTAL PROLAPSE REPAIR    . VAGINAL PROLAPSE REPAIR       A IV Location/Drains/Wounds Patient Lines/Drains/Airways Status   Active Line/Drains/Airways    None          Intake/Output Last 24 hours  Intake/Output Summary (Last 24 hours) at 02/08/2019 1909 Last data filed at  02/08/2019 1727 Gross per 24 hour  Intake 500 ml  Output -  Net 500 ml    Labs/Imaging Results for orders placed or performed during the hospital encounter of 02/08/19 (from the past 48 hour(s))  Basic metabolic panel     Status: Abnormal   Collection Time: 02/08/19  1:43 PM  Result Value Ref Range   Sodium 120 (L) 135 - 145 mmol/L   Potassium 4.1 3.5 - 5.1 mmol/L   Chloride 89 (L) 98 - 111 mmol/L   CO2 24 22 - 32 mmol/L   Glucose, Bld 209 (H) 70 - 99 mg/dL   BUN 7 (L) 8 - 23 mg/dL   Creatinine, Ser 0.32 (L) 0.44 - 1.00 mg/dL   Calcium 8.2 (L) 8.9 - 10.3 mg/dL   GFR calc non Af Amer >60 >60 mL/min   GFR calc Af Amer >60 >60 mL/min   Anion gap 7 5 - 15    Comment: Performed at [facility], [address] [place], [place] [address]  CBC     Status: None   Collection Time: 02/08/19  1:43 PM  Result Value Ref Range   WBC 7.9 4.0 - 10.5 K/uL   RBC 4.03 3.87 - 5.11 MIL/uL   Hemoglobin 13.0 12.0 - 15.0 g/dL   HCT 37.5 36.0 - 46.0 %   MCV 93.1 80.0 - 100.0 fL   MCH 32.3 26.0 -  34.0 pg   MCHC 34.7 30.0 - 36.0 g/dL   RDW 11.7 11.5 - 15.5 %   Platelets 322 150 - 400 K/uL   nRBC 0.0 0.0 - 0.2 %    Comment: Performed at [facility], [address] [place], [place] [address]  Hepatic function panel     Status: Abnormal   Collection Time: 02/08/19  1:43 PM  Result Value Ref Range   Total Protein 6.4 (L) 6.5 - 8.1 g/dL   Albumin 4.0 3.5 - 5.0 g/dL   AST 20 15 - 41 U/L   ALT 18 0 - 44 U/L   Alkaline Phosphatase 43 38 - 126 U/L   Total Bilirubin 0.5 0.3 - 1.2 mg/dL   Bilirubin, Direct <0.1 0.0 - 0.2 mg/dL   Indirect Bilirubin NOT CALCULATED 0.3 - 0.9 mg/dL    Comment: Performed at [facility], [address] [place], [place] [address]  Urinalysis, Complete w Microscopic     Status: Abnormal   Collection Time: 02/08/19  4:04 PM  Result Value Ref Range   Color, Urine STRAW (A) YELLOW   APPearance CLEAR (A) CLEAR   Specific Gravity, Urine 1.006  1.005 - 1.030   pH 7.0 5.0 - 8.0   Glucose, UA 150 (A) NEGATIVE mg/dL   Hgb urine dipstick NEGATIVE NEGATIVE   Bilirubin Urine NEGATIVE NEGATIVE   Ketones, ur NEGATIVE NEGATIVE mg/dL   Protein, ur NEGATIVE NEGATIVE mg/dL   Nitrite NEGATIVE NEGATIVE   Leukocytes,Ua NEGATIVE NEGATIVE   RBC / HPF 0-5 0 - 5 RBC/hpf   WBC, UA NONE SEEN 0 - 5 WBC/hpf   Bacteria, UA NONE SEEN NONE SEEN   Squamous Epithelial / LPF 0-5 0 - 5    Comment: Performed at [facility], [address] [place], [place] [address]   Dg Abdomen Acute W/chest  Result Date: 02/08/2019 CLINICAL DATA:  Weak and shaky EXAM: DG ABDOMEN ACUTE W/ 1V CHEST COMPARISON:  08/16/2014 FINDINGS: Single-view chest demonstrates no consolidation or effusion. Normal heart size. Aortic atherosclerosis. Supine and upright views of the abdomen demonstrate mild diffuse increased bowel gas throughout. No free air beneath the diaphragm. No radiopaque calculi. Scoliosis and degenerative changes of the spine. IMPRESSION: 1. Cardiomegaly without radiographic evidence for acute cardiopulmonary disease. Atelectasis or scar at the left base 2. Diffuse increased bowel gas without obstructive pattern. Electronically Signed   By: Donavan Foil M.D.   On: 02/08/2019 16:39    Pending Labs Unresulted Labs (From admission, onward)    Start     Ordered   02/08/19 1703  Osmolality  Add-on,   AD     02/08/19 1702   Signed and Held  Basic metabolic panel  Tomorrow morning,   R     Signed and Held   Signed and Held  CBC  Tomorrow morning,   R     Signed and Held   Signed and Held  Hemoglobin A1c  Tomorrow morning,   R     Signed and Held   Signed and Held  TSH  Tomorrow morning,   R     Signed and Held   Signed and Held  Sodium, urine, random  Once,   R     Signed and Held   Signed and Held  Osmolality, urine  Once,   R     Signed and Held          Vitals/Pain Today's Vitals   02/08/19 1700 02/08/19 1730 02/08/19  1800 02/08/19 1830  BP: (!)  188/86 (!) 173/81 (!) 198/77 (!) 185/71  Pulse: 66 (!) 59 61 (!) 58  Resp: (!) 24 (!) 26 18 18   Temp:      TempSrc:      SpO2: 99% 95% 97% 99%  PainSc:        Isolation Precautions No active isolations  Medications Medications  sodium chloride flush (NS) 0.9 % injection 3 mL (has no administration in time range)  sodium chloride 0.9 % bolus 500 mL (0 mLs Intravenous Stopped 02/08/19 1727)    Mobility walks Low fall risk   Focused Assessments Cardiac Assessment Handoff:  Cardiac Rhythm: Normal sinus rhythm, Sinus bradycardia Lab Results  Component Value Date   TROPONINI < 0.02 09/02/2012   No results found for: DDIMER Does the Patient currently have chest pain? No     R Recommendations: See Admitting Provider Note  Report given to:   Additional Notes:  Pt assisted to toilet with one person assist, baseline ambulatory by self at home.

## 2019-02-08 NOTE — H&P (Addendum)
[facility] at [place] NAME: Cathy Newman    MR#:  [id number]  DATE OF BIRTH:  [birth year]  DATE OF ADMISSION:  02/08/2019  PRIMARY CARE PHYSICIAN: Idelle Crouch, MD   REQUESTING/REFERRING PHYSICIAN: Merlyn Lot, MD  CHIEF COMPLAINT:   Chief Complaint  Patient presents with  . Weakness    HISTORY OF PRESENT ILLNESS:  Cathy Newman  is a 85 y.o. female with a known history of melanoma and glaucoma who presented to the ED with weakness and "shakiness" over the last couple of days.  She was seen by her PCP earlier in the week and was started on propranolol due to elevated blood pressures.  Her labs were checked at that visit and her sodium was found to be low at 128.  She has not been started on any other new medicines.  She denies any chest pain, shortness of breath, abdominal pain, urinary symptoms.  In the ED, she was hypertensive with blood pressures in the 967E systolic.  Labs are significant for sodium 120, glucose 209.  Chest x-ray and abdominal x-ray were unremarkable.  Hospitalists were called for admission.  PAST MEDICAL HISTORY:   Past Medical History:  Diagnosis Date  . Cancer ([place])    melanoma on knee  . Glaucoma     PAST SURGICAL HISTORY:   Past Surgical History:  Procedure Laterality Date  . ABDOMINAL HYSTERECTOMY    . BREAST BIOPSY Right   . CYSTECTOMY    . RECTAL PROLAPSE REPAIR    . VAGINAL PROLAPSE REPAIR      SOCIAL HISTORY:   Social History   Tobacco Use  . Smoking status: Former Research scientist (life sciences)  . Smokeless tobacco: Never Used  Substance Use Topics  . Alcohol use: Not on file    FAMILY HISTORY:   Family History  Problem Relation Age of Onset  . Breast cancer Sister   . Bladder Cancer Neg Hx   . Kidney cancer Neg Hx     DRUG ALLERGIES:   Allergies  Allergen Reactions  . Amoxicillin Hives  . Brinzolamide Other (See Comments)  . Other Other (See Comments)    MYCINS MYCINS    REVIEW OF  SYSTEMS:   Review of Systems  Constitutional: Positive for malaise/fatigue. Negative for chills and fever.  HENT: Negative for congestion and sore throat.   Eyes: Negative for blurred vision and double vision.  Respiratory: Negative for cough and shortness of breath.   Cardiovascular: Negative for chest pain and palpitations.  Gastrointestinal: Negative for nausea and vomiting.  Genitourinary: Negative for dysuria and urgency.  Musculoskeletal: Positive for neck pain. Negative for back pain.  Neurological: Positive for headaches. Negative for dizziness and tremors.  Psychiatric/Behavioral: Negative for depression. The patient is not nervous/anxious.     MEDICATIONS AT HOME:   Prior to Admission medications   Medication Sig Start Date End Date Taking? Authorizing Provider  aspirin EC 81 MG tablet Take 81 mg by mouth daily.    Yes [provider]  bimatoprost (LUMIGAN) 0.01 % SOLN Place 1 drop into both eyes at bedtime.    Yes [provider]  ESTRACE VAGINAL 0.1 MG/GM vaginal cream USE 1 GRAM VAGINALLY TWO TIMES A WEEK AND APPLY TOPICALLY NIGHTLY TO URETHRAL MEATUS Patient taking differently: Place 1 Applicatorful vaginally 2 (two) times a week. Apply nightly to urethral meatus 11/01/16  Yes Defrancesco, Alanda Slim, MD  propranolol ER (INDERAL LA) 60 MG 24 hr capsule Take 60  mg by mouth daily. 02/06/19  Yes [provider]  timolol (TIMOPTIC) 0.5 % ophthalmic solution Place 1 drop into both eyes 2 (two) times daily.     [provider]      VITAL SIGNS:  Blood pressure (!) 188/86, pulse 66, temperature 97.6 F (36.4 C), temperature source Oral, resp. rate (!) 24, SpO2 99 %.  PHYSICAL EXAMINATION:  Physical Exam  GENERAL:  85 y.o.-year-old patient lying in the bed with no acute distress. Thin-appearing. EYES: Pupils equal, round, reactive to light and accommodation. No scleral icterus. Extraocular muscles intact.  HEENT: Head atraumatic,  normocephalic. Oropharynx and nasopharynx clear.  NECK:  Supple, no jugular venous distention. No thyroid enlargement, no tenderness.  LUNGS: Normal breath sounds bilaterally, no wheezing, rales,rhonchi or crepitation. No use of accessory muscles of respiration.  CARDIOVASCULAR: RRR, S1, S2 normal. No murmurs, rubs, or gallops.  ABDOMEN: Soft, nontender, nondistended. Bowel sounds present. No organomegaly or mass.  EXTREMITIES: No pedal edema, cyanosis, or clubbing.  NEUROLOGIC: Cranial nerves II through XII are intact. Muscle strength 5/5 in all extremities. Sensation intact. Gait not checked.  PSYCHIATRIC: The patient is alert and oriented x 3.  SKIN: No obvious rash, lesion, or ulcer.   LABORATORY PANEL:   CBC Recent Labs  Lab 02/08/19 1343  WBC 7.9  HGB 13.0  HCT 37.5  PLT 322   ------------------------------------------------------------------------------------------------------------------  Chemistries  Recent Labs  Lab 02/08/19 1343  NA 120*  K 4.1  CL 89*  CO2 24  GLUCOSE 209*  BUN 7*  CREATININE 0.32*  CALCIUM 8.2*  AST 20  ALT 18  ALKPHOS 43  BILITOT 0.5   ------------------------------------------------------------------------------------------------------------------  Cardiac Enzymes No results for input(s): TROPONINI in the last 168 hours. ------------------------------------------------------------------------------------------------------------------  RADIOLOGY:  Dg Abdomen Acute W/chest  Result Date: 02/08/2019 CLINICAL DATA:  Weak and shaky EXAM: DG ABDOMEN ACUTE W/ 1V CHEST COMPARISON:  08/16/2014 FINDINGS: Single-view chest demonstrates no consolidation or effusion. Normal heart size. Aortic atherosclerosis. Supine and upright views of the abdomen demonstrate mild diffuse increased bowel gas throughout. No free air beneath the diaphragm. No radiopaque calculi. Scoliosis and degenerative changes of the spine. IMPRESSION: 1. Cardiomegaly without  radiographic evidence for acute cardiopulmonary disease. Atelectasis or scar at the left base 2. Diffuse increased bowel gas without obstructive pattern. Electronically Signed   By: Donavan Foil M.D.   On: 02/08/2019 16:39      IMPRESSION AND PLAN:   Acute on chronic hyponatremia- sodium 120, corrects to 123 after adjusting for hyperglycemia.  Unclear etiology.  Patient does not take any diuretics.  Check serum osmolality, urine sodium, urine osmolality, and TSH. CT chest ordered to rule out lung mass. Will give normal saline.  Nephrology consult.  Hyperglycemia-does not have a diagnosis of diabetes.  Check A1c.  Hypertension- patient recently started on propranolol by PCP.  Hydralazine IV as needed.  All the records are reviewed and case discussed with ED provider. Management plans discussed with the patient, family and they are in agreement.  CODE STATUS: Full  TOTAL TIME TAKING CARE OF THIS PATIENT: 45 minutes.    Berna Spare Mayo M.D on 02/08/2019 at 6:19 PM  Between 7am to 6pm - Pager - [phone]  After 6pm go to [email or web address] - Proofreader  [facility]  Office  [phone]  CC: Primary care physician; Idelle Crouch, MD   Note: This dictation was prepared with Dragon dictation along with smaller phrase technology. Any transcriptional errors that result from  this process are unintentional.

## 2019-02-09 DIAGNOSIS — E871 Hypo-osmolality and hyponatremia: Secondary | ICD-10-CM | POA: Diagnosis not present

## 2019-02-09 LAB — BASIC METABOLIC PANEL
Anion gap: 8 (ref 5–15)
BUN: 7 mg/dL — ABNORMAL LOW (ref 8–23)
CO2: 23 mmol/L (ref 22–32)
Calcium: 8.6 mg/dL — ABNORMAL LOW (ref 8.9–10.3)
Chloride: 99 mmol/L (ref 98–111)
Creatinine, Ser: 0.41 mg/dL — ABNORMAL LOW (ref 0.44–1.00)
GFR calc Af Amer: >60 mL/min (ref >60)
GFR calc non Af Amer: >60 mL/min (ref >60)
Glucose, Bld: 119 mg/dL — ABNORMAL HIGH (ref 70–99)
Potassium: 3.7 mmol/L (ref 3.5–5.1)
Sodium: 130 mmol/L — ABNORMAL LOW (ref 135–145)

## 2019-02-09 LAB — HEMOGLOBIN A1C
Hgb A1c MFr Bld: 6 % — ABNORMAL HIGH (ref 4.8–5.6)
Mean Plasma Glucose: 125.5 mg/dL

## 2019-02-09 LAB — OSMOLALITY, URINE: Osmolality, Ur: 269 mOsm/kg — ABNORMAL LOW (ref 300–900)

## 2019-02-09 LAB — CBC
HCT: 37.1 % (ref 36.0–46.0)
Hemoglobin: 12.9 g/dL (ref 12.0–15.0)
MCH: 32.2 pg (ref 26.0–34.0)
MCHC: 34.8 g/dL (ref 30.0–36.0)
MCV: 92.5 fL (ref 80.0–100.0)
Platelets: 272 10*3/uL (ref 150–400)
RBC: 4.01 MIL/uL (ref 3.87–5.11)
RDW: 11.8 % (ref 11.5–15.5)
WBC: 6.1 10*3/uL (ref 4.0–10.5)
nRBC: 0 % (ref 0.0–0.2)

## 2019-02-09 LAB — SODIUM, URINE, RANDOM: Sodium, Ur: 70 mmol/L

## 2019-02-09 LAB — TSH: TSH: 4.482 u[IU]/mL (ref 0.350–4.500)

## 2019-02-09 MED ORDER — AMLODIPINE BESYLATE 2.5 MG PO TABS: 2.5000 mg | ORAL_TABLET | Freq: Every day | ORAL | 11 refills | Status: DC | PRN

## 2019-02-09 NOTE — Discharge Summary (Signed)
[facility] at [place] NAME: Cathy Newman    MR#:  [id number]  DATE OF BIRTH:  1925/01/05  DATE OF ADMISSION:  02/08/2019 ADMITTING PHYSICIAN: Sela Hua, MD  DATE OF DISCHARGE: 02/09/2019  PRIMARY CARE PHYSICIAN: Idelle Crouch, MD    ADMISSION DIAGNOSIS:  Hyponatremia [E87.1] Weakness [R53.1]  DISCHARGE DIAGNOSIS:  Active Problems:   Acute hyponatremia   SECONDARY DIAGNOSIS:   Past Medical History:  Diagnosis Date  . Cancer ([place])    melanoma on knee  . Glaucoma     HOSPITAL COURSE:   85 year old female with history of melanoma and glaucoma recently started on medications by her primary care physician due to elevated blood pressure who presents with generalized weakness.  1.  Hyponatremia with generalized weakness: Patient has a history of chronic hyponatremia: Patient's sodium level has improved with IV fluids.  TSH was within normal limits.   2. Well-defined 15 x 5 x 16 mm soft tissue density in the left lung apex abutting the aortic arch. Morphology and well-defined margins favor atelectasis or scarring over neoplasm. There are no prior exams available for comparison. Recommend followup as a pulmonary nodule, consider one of the following in 3 months for both low-risk and high-risk individuals: (a) repeat chest CT, (b) follow-up PET-CT, or less peripheral (c) tissue sampling. This recommendation follows the consensus statement: Guidelines for Management of Incidental Pulmonary Nodules Detected on CT Images: From the Fleischner Society 2017; Radiology 2017; 284:228-243.  3.  Hypertension: Patient has had elevated blood pressure.  She is recently started on propanolol by PCP.  Patient will be discharged on as needed Norvasc. She is asked to log her blood pressures daily and provide this information to her PCP to make adjustments if needed. 4.  Glaucoma: Continue eyedrops  DISCHARGE CONDITIONS AND DIET:  Stable Regular  diet  CONSULTS OBTAINED:  Treatment Team:  Gillis Santa, MD  DRUG ALLERGIES:   Allergies  Allergen Reactions  . Amoxicillin Hives  . Brinzolamide Other (See Comments)  . Other Other (See Comments)    MYCINS MYCINS    DISCHARGE MEDICATIONS:   Allergies as of 02/09/2019      Reactions   Amoxicillin Hives   Brinzolamide Other (See Comments)   Other Other (See Comments)   MYCINS MYCINS      Medication List    TAKE these medications   amLODipine 2.5 MG tablet Commonly known as:  NORVASC Take 1 tablet (2.5 mg total) by mouth daily as needed. If bp is >180   aspirin EC 81 MG tablet Take 81 mg by mouth daily.   ESTRACE VAGINAL 0.1 MG/GM vaginal cream Generic drug:  estradiol USE 1 GRAM VAGINALLY TWO TIMES A WEEK AND APPLY TOPICALLY NIGHTLY TO URETHRAL MEATUS What changed:  See the new instructions.   Lumigan 0.01 % Soln Generic drug:  bimatoprost Place 1 drop into both eyes at bedtime.   propranolol ER 60 MG 24 hr capsule Commonly known as:  INDERAL LA Take 60 mg by mouth daily.   timolol 0.5 % ophthalmic solution Commonly known as:  TIMOPTIC Place 1 drop into both eyes 2 (two) times daily.         Today   CHIEF COMPLAINT:  Doing ok this am   VITAL SIGNS:  Blood pressure (!) 191/80, pulse 64, temperature 98.1 F (36.7 C), temperature source Oral, resp. rate 18, height 5\' 1"  (1.549 m), weight 50.7 kg, SpO2 97 %.   REVIEW OF  SYSTEMS:  Review of Systems  Constitutional: Negative.  Negative for chills, fever and malaise/fatigue.  HENT: Negative.  Negative for ear discharge, ear pain, hearing loss, nosebleeds and sore throat.   Eyes: Negative.  Negative for blurred vision and pain.  Respiratory: Negative.  Negative for cough, hemoptysis, shortness of breath and wheezing.   Cardiovascular: Negative.  Negative for chest pain, palpitations and leg swelling.  Gastrointestinal: Negative.  Negative for abdominal pain, blood in stool, diarrhea, nausea and  vomiting.  Genitourinary: Negative.  Negative for dysuria.  Musculoskeletal: Negative.  Negative for back pain.  Skin: Negative.   Neurological: Negative for dizziness, tremors, speech change, focal weakness, seizures and headaches.  Endo/Heme/Allergies: Negative.  Does not bruise/bleed easily.  Psychiatric/Behavioral: Negative.  Negative for depression, hallucinations and suicidal ideas.     PHYSICAL EXAMINATION:  GENERAL:  85 y.o.-year-old patient lying in the bed with no acute distress.  NECK:  Supple, no jugular venous distention. No thyroid enlargement, no tenderness.  LUNGS: Normal breath sounds bilaterally, no wheezing, rales,rhonchi  No use of accessory muscles of respiration.  CARDIOVASCULAR: S1, S2 normal. No murmurs, rubs, or gallops.  ABDOMEN: Soft, non-tender, non-distended. Bowel sounds present. No organomegaly or mass.  EXTREMITIES: No pedal edema, cyanosis, or clubbing.  PSYCHIATRIC: The patient is alert and oriented x 3.  SKIN: No obvious rash, lesion, or ulcer.   DATA REVIEW:   CBC Recent Labs  Lab 02/09/19 0428  WBC 6.1  HGB 12.9  HCT 37.1  PLT 272    Chemistries  Recent Labs  Lab 02/08/19 1343 02/09/19 0428  NA 120* 130*  K 4.1 3.7  CL 89* 99  CO2 24 23  GLUCOSE 209* 119*  BUN 7* 7*  CREATININE 0.32* 0.41*  CALCIUM 8.2* 8.6*  AST 20  --   ALT 18  --   ALKPHOS 43  --   BILITOT 0.5  --     Cardiac Enzymes No results for input(s): TROPONINI in the last 168 hours.  Microbiology Results  @MICRORSLT48 @  RADIOLOGY:  Ct Chest W Contrast  Result Date: 02/08/2019 CLINICAL DATA:  Hyponatremia, rule out lung mass. Hypo-osmolality. EXAM: CT CHEST WITH CONTRAST TECHNIQUE: Multidetector CT imaging of the chest was performed during intravenous contrast administration. CONTRAST:  88mL OMNIPAQUE IOHEXOL 300 MG/ML  SOLN COMPARISON:  Radiograph earlier this day. FINDINGS: Cardiovascular: Tortuous atherosclerotic thoracic aorta. No dissection. Mild  cardiomegaly. Dilatation of the main pulmonary artery 3.6 cm. No visualized pulmonary embolus to the lobar level. There are coronary artery calcifications. Mitral annulus calcifications. No pericardial fluid. Mediastinum/Nodes: No enlarged mediastinal or hilar lymph nodes. Esophagus slightly patulous, no esophageal wall thickening. 8 mm left thyroid nodule, considered incidental in a patient of this age. Lungs/Pleura: Triangular well-defined 15 x 0.5 x 16 cm soft tissue density (image 28 series 2) in the left lung apex abuts the aortic arch, nonspecific. No other focal airspace disease the lungs are otherwise clear. Trace right middle lobe scarring or subsegmental atelectasis. No consolidation, pleural effusion, or pulmonary edema. Calcified granuloma in the right lower lobe. Upper Abdomen: No acute finding. Musculoskeletal: Degenerative change in the thoracic spine. There are no acute or suspicious osseous abnormalities. IMPRESSION: 1. Well-defined 15 x 5 x 16 mm soft tissue density in the left lung apex abutting the aortic arch. Morphology and well-defined margins favor atelectasis or scarring over neoplasm. There are no prior exams available for comparison. Recommend followup as a pulmonary nodule, consider one of the following in 3 months for both low-risk  and high-risk individuals: (a) repeat chest CT, (b) follow-up PET-CT, or less peripheral (c) tissue sampling. This recommendation follows the consensus statement: Guidelines for Management of Incidental Pulmonary Nodules Detected on CT Images: From the Fleischner Society 2017; Radiology 2017; 284:228-243. 2. Mild cardiomegaly. Coronary artery calcifications. Dilatation of the main pulmonary artery suggesting pulmonary arterial hypertension. 3.  Aortic Atherosclerosis (ICD10-I70.0). Electronically Signed   By: Keith Rake M.D.   On: 02/08/2019 21:55   Dg Abdomen Acute W/chest  Result Date: 02/08/2019 CLINICAL DATA:  Weak and shaky EXAM: DG ABDOMEN ACUTE  W/ 1V CHEST COMPARISON:  08/16/2014 FINDINGS: Single-view chest demonstrates no consolidation or effusion. Normal heart size. Aortic atherosclerosis. Supine and upright views of the abdomen demonstrate mild diffuse increased bowel gas throughout. No free air beneath the diaphragm. No radiopaque calculi. Scoliosis and degenerative changes of the spine. IMPRESSION: 1. Cardiomegaly without radiographic evidence for acute cardiopulmonary disease. Atelectasis or scar at the left base 2. Diffuse increased bowel gas without obstructive pattern. Electronically Signed   By: Donavan Foil M.D.   On: 02/08/2019 16:39      Allergies as of 02/09/2019      Reactions   Amoxicillin Hives   Brinzolamide Other (See Comments)   Other Other (See Comments)   MYCINS MYCINS      Medication List    TAKE these medications   amLODipine 2.5 MG tablet Commonly known as:  NORVASC Take 1 tablet (2.5 mg total) by mouth daily as needed. If bp is >180   aspirin EC 81 MG tablet Take 81 mg by mouth daily.   ESTRACE VAGINAL 0.1 MG/GM vaginal cream Generic drug:  estradiol USE 1 GRAM VAGINALLY TWO TIMES A WEEK AND APPLY TOPICALLY NIGHTLY TO URETHRAL MEATUS What changed:  See the new instructions.   Lumigan 0.01 % Soln Generic drug:  bimatoprost Place 1 drop into both eyes at bedtime.   propranolol ER 60 MG 24 hr capsule Commonly known as:  INDERAL LA Take 60 mg by mouth daily.   timolol 0.5 % ophthalmic solution Commonly known as:  TIMOPTIC Place 1 drop into both eyes 2 (two) times daily.          Management plans discussed with the patient and she is in agreement. Stable for discharge   Patient should follow up with pcp  CODE STATUS:     Code Status Orders  (From admission, onward)         Start     Ordered   02/08/19 2053  Full code  Continuous     02/08/19 2052        Code Status History    This patient has a current code status but no historical code status.    Advance Directive  Documentation     Most Recent Value  Type of Advance Directive  Living will  Pre-existing out of facility DNR order (yellow form or pink MOST form)  -  "MOST" Form in Place?  -      TOTAL TIME TAKING CARE OF THIS PATIENT: 38 minutes.    Note: This dictation was prepared with Dragon dictation along with smaller phrase technology. Any transcriptional errors that result from this process are unintentional.  Jacquie Lukes M.D on 02/09/2019 at 10:56 AM  Between 7am to 6pm - Pager - [phone] After 6pm go to [email or web address] - password [email or web address] [facility]  Office  [phone]  CC: Primary care physician; Idelle Crouch, MD

## 2019-02-09 NOTE — Care Management (Signed)
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

## 2019-02-09 NOTE — Care Management CC44 (Signed)
Condition Code 44 Documentation Completed  Patient Details  Name: Cathy Newman MRN: [id number] Date of Birth: 12-05-1925   Condition Code 44 given:  Yes Patient signature on Condition Code 44 notice:  Yes Documentation of 2 MD's agreement:  Yes Code 44 added to claim:       Su Hilt, RN 02/09/2019, 12:42 PM

## 2019-02-09 NOTE — Progress Notes (Signed)
Ch visited pt regarding a prayer request. Pt seemed alert and was ambulating well upon ch arrival. Pt shared that she was concerned about d/c so soon. She was concerned about her B/P not being stable. While nurse was present ch shared pt concerns about her d/c. Nurse shared that the pt was not d/c yet and could tell the pt was anxious. Pt still had to see nephrologist today. Ch also shared that the pt would want to see if they qualify for H-H. Nurse shred that an order would hv to be placed. Ch allowed time for pt to share her life story and what she hopes to accomplish in the next years. Pt still has a quality of life that she wants to maintain and is hopeful to see her grands grow up. Ch had the pt to lead a prayer while she was surrounded by family and the ch. Pt was thankful of visit.     02/09/19 1200  Clinical Encounter Type  Visited With Patient and family together;Health care provider  Visit Type Psychological support;Spiritual support;Social support  Referral From Chaplain  Consult/Referral To Chaplain  Spiritual Encounters  Spiritual Needs Prayer;Emotional;Grief support  Stress Factors  Patient Stress Factors Major life changes;Loss of control;Health changes  Family Stress Factors None identified

## 2019-02-09 NOTE — Evaluation (Signed)
Physical Therapy Evaluation Patient Details Name: Shawanna Zanders MRN: [id number] DOB: [birth year] Today's Date: 02/09/2019   History of Present Illness  From MD H&P: Pt is a 85 y.o. female with a known history of melanoma and glaucoma who presented to the ED with weakness and "shakiness" over the last couple of days.  She was seen by her PCP earlier in the week and was started on propranolol due to elevated blood pressures.  Her labs were checked at that visit and her sodium was found to be low at 128.  She has not been started on any other new medicines.  She denies any chest pain, shortness of breath, abdominal pain, urinary symptoms.  In the ED, she was hypertensive with blood pressures in the 025K systolic.  Labs are significant for sodium 120, glucose 209.  Chest x-ray and abdominal x-ray were unremarkable.  Hospitalists were called for admission.  Assessment includes: Acute on chronic hyponatremia, Hyperglycemia, and HTN.     Clinical Impression  Pt presents with no noted deficits in strength, transfers, mobility, gait, balance, and activity tolerance.  Pt was able to perform bed mobility and transfers Independently with good control and stability.  Pt was able to amb 200' with a RW with good cadence and stability with vitals WNL.  Pt was able to amb 30' without an AD with no noted instability.  Will complete PT orders at this time but will reassess pt pending a change in status upon receipt of new PT orders.       Follow Up Recommendations No PT follow up    Equipment Recommendations  None recommended by PT    Recommendations for Other Services       Precautions / Restrictions Precautions Precautions: None Restrictions Weight Bearing Restrictions: No      Mobility  Bed Mobility Overal bed mobility: Independent                Transfers Overall transfer level: Independent                  Ambulation/Gait Ambulation/Gait assistance: Supervision Gait Distance  (Feet): 200 Feet Assistive device: None;Rolling walker (2 wheeled) Gait Pattern/deviations: Step-through pattern   Gait velocity interpretation: >2.62 ft/sec, indicative of community ambulatory General Gait Details: Good cadence and stability both with RW and without AD  Stairs            Wheelchair Mobility    Modified Rankin (Stroke Patients Only)       Balance Overall balance assessment: No apparent balance deficits (not formally assessed)                                           Pertinent Vitals/Pain Pain Assessment: No/denies pain    Home Living Family/patient expects to be discharged to:: Private residence Living Arrangements: Alone Available Help at Discharge: Family;Available PRN/intermittently Type of Home: House Home Access: Stairs to enter Entrance Stairs-Rails: Left Entrance Stairs-Number of Steps: 2 Home Layout: Two level;Able to live on main level with bedroom/bathroom Home Equipment: Grab bars - toilet;Grab bars - tub/shower;Walker - 2 wheels;Cane - quad      Prior Function Level of Independence: Independent         Comments: Pt Ind with amb community distances without AD, walks almost 2 miles/day, no fall history, drives, helps 74 yo neighbor take trash out, Ind with ADLs  Hand Dominance        Extremity/Trunk Assessment   Upper Extremity Assessment Upper Extremity Assessment: Overall WFL for tasks assessed    Lower Extremity Assessment Lower Extremity Assessment: Overall WFL for tasks assessed       Communication   Communication: No difficulties  Cognition Arousal/Alertness: Awake/alert Behavior During Therapy: WFL for tasks assessed/performed Overall Cognitive Status: Within Functional Limits for tasks assessed                                        General Comments      Exercises     Assessment/Plan    PT Assessment Patent does not need any further PT services  PT Problem List          PT Treatment Interventions      PT Goals (Current goals can be found in the Care Plan section)  Acute Rehab PT Goals PT Goal Formulation: All assessment and education complete, DC therapy    Frequency     Barriers to discharge        Co-evaluation               AM-PAC PT "6 Clicks" Mobility  Outcome Measure Help needed turning from your back to your side while in a flat bed without using bedrails?: None Help needed moving from lying on your back to sitting on the side of a flat bed without using bedrails?: None Help needed moving to and from a bed to a chair (including a wheelchair)?: None Help needed standing up from a chair using your arms (e.g., wheelchair or bedside chair)?: None Help needed to walk in hospital room?: None Help needed climbing 3-5 steps with a railing? : None 6 Click Score: 24    End of Session Equipment Utilized During Treatment: Gait belt Activity Tolerance: Patient tolerated treatment well Patient left: in bed;with family/visitor present;Other (comment)(MD in room ) Nurse Communication: Mobility status PT Visit Diagnosis: Muscle weakness (generalized) (M62.81)    Time: 0539-7673 PT Time Calculation (min) (ACUTE ONLY): 28 min   Charges:   PT Evaluation $PT Eval Low Complexity: 1 Low          D. Scott Gurveer Colucci PT, DPT 02/09/19, 10:54 AM

## 2019-02-09 NOTE — Care Management Obs Status (Signed)
[facility] NOTIFICATION   Patient Details  Name: Cathy Newman MRN: [id number] Date of Birth: [birth year]   Medicare Observation Status Notification Given:  Yes    Su Hilt, RN 02/09/2019, 12:42 PM

## 2019-02-09 NOTE — Care Management Note (Signed)
Case Management Note  Patient Details  Name: Cathy Newman MRN: [id number] Date of Birth: May 13, 1925  Subjective/Objective:                   Met with the patient and her Daughter to discuss DC needs\\   Action/Plan:   ExpMet with the patient and her daughter to discuss DC plan and needs The patient does not have a DME needs but wants to have a [facility] nurse come to check BP once a week,  Printed the [facility] list and provided to the patient for choice per CMS.gov, copy placed on the chart, she is going to discuss with her daughter and decide [facility]ses [facility] and has Dr. Doy Hutching as PCP No other needs at home   ected Discharge Date:  02/09/19               Expected Discharge Plan:     In-House Referral:  Clinical Social Work  Discharge planning Services  CM Consult, Other - See comment  Post Acute Care Choice:  Home Health, Resumption of Svcs/PTA Provider Choice offered to:  Patient, Adult Children  DME Arranged:    DME Agency:     HH Arranged:  RN [place] Agency:     Status of Service:     If discussed at [employer] of Stay Meetings, dates discussed:    Additional Comments:  Su Hilt, RN 02/09/2019, 12:46 PM

## 2019-02-12 ENCOUNTER — Other Ambulatory Visit: Payer: Self-pay | Admitting: Physician Assistant

## 2019-02-12 DIAGNOSIS — R918 Other nonspecific abnormal finding of lung field: Secondary | ICD-10-CM

## 2019-02-16 ENCOUNTER — Other Ambulatory Visit: Payer: Self-pay

## 2019-02-16 ENCOUNTER — Encounter: Payer: Self-pay | Admitting: Emergency Medicine

## 2019-02-16 ENCOUNTER — Inpatient Hospital Stay
Admission: EM | Admit: 2019-02-16 | Discharge: 2019-02-20 | DRG: 645 | Disposition: A | Discharge status: Home / Self Care | Payer: Medicare Other | Attending: Internal Medicine | Admitting: Internal Medicine

## 2019-02-16 DIAGNOSIS — Z23 Encounter for immunization: Secondary | ICD-10-CM

## 2019-02-16 DIAGNOSIS — Z87891 Personal history of nicotine dependence: Secondary | ICD-10-CM

## 2019-02-16 DIAGNOSIS — R631 Polydipsia: Secondary | ICD-10-CM | POA: Diagnosis present

## 2019-02-16 DIAGNOSIS — E222 Syndrome of inappropriate secretion of antidiuretic hormone: Principal | ICD-10-CM | POA: Diagnosis present

## 2019-02-16 DIAGNOSIS — R531 Weakness: Secondary | ICD-10-CM

## 2019-02-16 DIAGNOSIS — Z85820 Personal history of malignant melanoma of skin: Secondary | ICD-10-CM

## 2019-02-16 DIAGNOSIS — Z880 Allergy status to penicillin: Secondary | ICD-10-CM

## 2019-02-16 DIAGNOSIS — H409 Unspecified glaucoma: Secondary | ICD-10-CM | POA: Diagnosis present

## 2019-02-16 DIAGNOSIS — M109 Gout, unspecified: Secondary | ICD-10-CM | POA: Diagnosis present

## 2019-02-16 DIAGNOSIS — J984 Other disorders of lung: Secondary | ICD-10-CM | POA: Diagnosis present

## 2019-02-16 DIAGNOSIS — Z79890 Hormone replacement therapy (postmenopausal): Secondary | ICD-10-CM

## 2019-02-16 DIAGNOSIS — Z7982 Long term (current) use of aspirin: Secondary | ICD-10-CM

## 2019-02-16 DIAGNOSIS — E871 Hypo-osmolality and hyponatremia: Secondary | ICD-10-CM | POA: Diagnosis present

## 2019-02-16 DIAGNOSIS — Z803 Family history of malignant neoplasm of breast: Secondary | ICD-10-CM

## 2019-02-16 DIAGNOSIS — I10 Essential (primary) hypertension: Secondary | ICD-10-CM | POA: Diagnosis present

## 2019-02-16 LAB — CBC
HEMATOCRIT: 36.5 % (ref 36.0–46.0)
HEMOGLOBIN: 13.2 g/dL (ref 12.0–15.0)
MCH: 32 pg (ref 26.0–34.0)
MCHC: 36.2 g/dL — ABNORMAL HIGH (ref 30.0–36.0)
MCV: 88.6 fL (ref 80.0–100.0)
Platelets: 304 10*3/uL (ref 150–400)
RBC: 4.12 MIL/uL (ref 3.87–5.11)
RDW: 11.5 % (ref 11.5–15.5)
WBC: 9 10*3/uL (ref 4.0–10.5)
nRBC: 0 % (ref 0.0–0.2)

## 2019-02-16 LAB — BASIC METABOLIC PANEL
Anion gap: 11 (ref 5–15)
BUN: 8 mg/dL (ref 8–23)
CO2: 20 mmol/L — ABNORMAL LOW (ref 22–32)
Calcium: 8.5 mg/dL — ABNORMAL LOW (ref 8.9–10.3)
Chloride: 86 mmol/L — ABNORMAL LOW (ref 98–111)
Creatinine, Ser: 0.46 mg/dL (ref 0.44–1.00)
GFR calc Af Amer: >60 mL/min (ref >60)
GFR calc non Af Amer: >60 mL/min (ref >60)
Glucose, Bld: 137 mg/dL — ABNORMAL HIGH (ref 70–99)
Potassium: 3.7 mmol/L (ref 3.5–5.1)
Sodium: 117 mmol/L — CL (ref 135–145)

## 2019-02-16 LAB — TROPONIN I: Troponin I: <0.03 ng/mL (ref <0.03)

## 2019-02-16 MED ORDER — SODIUM CHLORIDE 0.9 % IV BOLUS
1000.0000 mL | Freq: Once | INTRAVENOUS | Status: AC
  Administered 2019-02-16: 1000 mL via INTRAVENOUS

## 2019-02-16 MED ORDER — SODIUM CHLORIDE 0.9% FLUSH
3.0000 mL | Freq: Once | INTRAVENOUS | Status: AC
  Administered 2019-02-18: 3 mL via INTRAVENOUS

## 2019-02-16 NOTE — ED Notes (Signed)
CRITICAL LAB: SODIUM is 117, Ms Lab, Dr. Jacqualine Code notified, orders received

## 2019-02-16 NOTE — ED Provider Notes (Signed)
[facility] Emergency Department Provider Note   ____________________________________________   First MD Initiated Contact with Patient 02/16/19 2054     (approximate)  I have reviewed the triage vital signs and the nursing notes.   HISTORY  Chief Complaint Weakness and Anxiety    HPI Cathy Newman is a 85 y.o. female here for evaluation for fatigue and weakness, also a little bit of confusion  Patient and her daughter state that she started feeling very weak for the last couple of days, feels similar to when she had to be admitted to the hospital before.  She reports that she continues to drink fluids well, drinking a few Gatorade's every day but not excessively.  She does feels just slightly thirsty.  No nausea or vomiting.  No headache.  Family reports she seems like she is just had a slight amount of slowing or confusion over the last couple days as well.  Are concerned she could have a low sodium  She denies any weakness in one particular arm or leg but just feels weak all over.  No fevers or chills.  No nausea or vomiting.  No chest pain.  Past Medical History:  Diagnosis Date  . Cancer ([place])    melanoma on knee  . Glaucoma     Patient Active Problem List   Diagnosis Date Noted  . Acute hyponatremia 02/08/2019  . Atrophic vaginitis 02/10/2015  . Glaucoma 02/10/2015  . HLD (hyperlipidemia) 02/10/2015  . Hypoglycemia 02/10/2015  . Malignant melanoma of knee ([place]) 02/10/2015  . Acne erythematosa 02/10/2015  . Thyroid nodule 02/10/2015  . Below normal amount of sodium in the blood 02/10/2015  . Abnormal blood sugar 10/28/2014  . Abnormal glucose level 10/28/2014  . Acute infection of nasal sinus 07/19/2014  . Female genuine stress incontinence 05/01/2014  . Prolapse of urethra 05/01/2014  . Urge incontinence of urine 05/01/2014    Past Surgical History:  Procedure Laterality Date  . ABDOMINAL HYSTERECTOMY    . BREAST BIOPSY Right    . CYSTECTOMY    . RECTAL PROLAPSE REPAIR    . VAGINAL PROLAPSE REPAIR      Prior to Admission medications   Medication Sig Start Date End Date Taking? Authorizing Provider  amLODipine (NORVASC) 2.5 MG tablet Take 1 tablet (2.5 mg total) by mouth daily as needed. If bp is >180 02/09/19 02/09/20 Yes Mody, Ulice Bold, MD  aspirin EC 81 MG tablet Take 81 mg by mouth daily.    Yes [provider]  bimatoprost (LUMIGAN) 0.01 % SOLN Place 1 drop into both eyes at bedtime.    Yes [provider]  ESTRACE VAGINAL 0.1 MG/GM vaginal cream USE 1 GRAM VAGINALLY TWO TIMES A WEEK AND APPLY TOPICALLY NIGHTLY TO URETHRAL MEATUS Patient taking differently: Place 1 Applicatorful vaginally 2 (two) times a week. Apply nightly to urethral meatus 11/01/16  Yes Defrancesco, Alanda Slim, MD  propranolol ER (INDERAL LA) 60 MG 24 hr capsule Take 60 mg by mouth daily. 02/06/19  Yes [provider]  timolol (TIMOPTIC) 0.5 % ophthalmic solution Place 1 drop into both eyes 2 (two) times daily.     [provider]    Allergies Amoxicillin; Brinzolamide; and Other  Family History  Problem Relation Age of Onset  . Breast cancer Sister   . Bladder Cancer Neg Hx   . Kidney cancer Neg Hx     Social History Social History   Tobacco Use  . Smoking status: Former Research scientist (life sciences)  .  Smokeless tobacco: Never Used  Substance Use Topics  . Alcohol use: Not on file  . Drug use: Never    Review of Systems Constitutional: No fever/chills Eyes: No visual changes. ENT: No sore throat. Cardiovascular: Denies chest pain. Respiratory: Denies shortness of breath. Gastrointestinal: No abdominal pain.   Genitourinary: Negative for dysuria. Musculoskeletal: Negative for back pain. Skin: Negative for rash. Neurological: Negative for headaches, areas of focal weakness or numbness.  Feels tired all over.    ____________________________________________   PHYSICAL EXAM:  VITAL SIGNS: ED Triage Vitals  Enc  Vitals Group     BP 02/16/19 2051 (!) 158/88     Pulse Rate 02/16/19 2051 66     Resp 02/16/19 2051 (!) 21     Temp 02/16/19 2051 97.8 F (36.6 C)     Temp Source 02/16/19 2051 Oral     SpO2 02/16/19 2040 96 %     Weight 02/16/19 2052 111 lb 12.4 oz (50.7 kg)     Height 02/16/19 2052 5\' 1"  (1.549 m)     Head Circumference --      Peak Flow --      Pain Score 02/16/19 2052 0     Pain Loc --      Pain Edu? --      Excl. in [place]? --     Constitutional: Alert and oriented. Well appearing and in no acute distress. Eyes: Conjunctivae are normal. Head: Atraumatic. Nose: No congestion/rhinnorhea. Mouth/Throat: Mucous membranes are moist. Neck: No stridor.  Cardiovascular: Normal rate, regular rhythm. Grossly normal heart sounds.  Good peripheral circulation. Respiratory: Normal respiratory effort.  No retractions. Lungs CTAB. Gastrointestinal: Soft and nontender. No distention. Musculoskeletal: No lower extremity tenderness nor edema.  She does have some mild about 4-5 strength in all extremities.  No focal deficits. Neurologic:  Normal speech and language. No gross focal neurologic deficits are appreciated.  Skin:  Skin is warm, dry and intact. No rash noted. Psychiatric: Mood and affect are normal. Speech and behavior are normal.  ____________________________________________   LABS (all labs ordered are listed, but only abnormal results are displayed)  Labs Reviewed  BASIC METABOLIC PANEL - Abnormal; Notable for the following components:      Result Value   Sodium 117 (*)    Chloride 86 (*)    CO2 20 (*)    Glucose, Bld 137 (*)    Calcium 8.5 (*)    All other components within normal limits  CBC - Abnormal; Notable for the following components:   MCHC 36.2 (*)    All other components within normal limits  TROPONIN I  URINALYSIS, COMPLETE (UACMP) WITH MICROSCOPIC  SODIUM, URINE, RANDOM  CHLORIDE, URINE, RANDOM  OSMOLALITY, URINE  CBG MONITORING, ED    ____________________________________________  EKG  Reviewed and entered by me at 2040 Heart rate 65 QRS 100 QTc 430 Normal sinus rhythm, T wave inversions in aVF V3 V4 and V5.  Compared to her previous this appears to be relatively unchanged.  Of note the patient does not complain of any pulmonary cardiac symptoms ____________________________________________  RADIOLOGY  No results found.  ____________________________________________   PROCEDURES  Procedure(s) performed: None  Procedures  Critical Care performed: No  ____________________________________________   INITIAL IMPRESSION / ASSESSMENT AND PLAN / ED COURSE  Pertinent labs & imaging results that were available during my care of the patient were reviewed by me and considered in my medical decision making (see chart for details).   Patient presents with generalized  fatigue and weakness.  Recent admission with similar and hyponatremia is notable.  I suspect based on the clinical history of hyponatremia is the factor causing her weakness today.  Discussed case with Dr. Candiss Norse of nephrology, he advises having the hospitalist see the patient prior to any initiation of treatments at this time.  Patient was originally slated to get a liter of normal saline but this was canceled and she did not receive it.  Continue to monitor, further work-up and care discussed with Dr. Marcille Blanco of the hospitalist service.  Consultation by nephrology has been requested.  No focal abnormalities, no cardiac pulmonary or vascular symptoms.  No central neurologic symptoms except for some slight slowing of thought noted by family, I suspect this is well explained by her hyponatremia.  Differential diagnosis is broad, of note she also was slated to obtain a PET scan which has not been completed and this could finger into the cause of her hyponatremia.  To my exam she appears fairly euvolemic.      ____________________________________________    FINAL CLINICAL IMPRESSION(S) / ED DIAGNOSES  Final diagnoses:  Hyponatremia  Generalized weakness        Note:  This document was prepared using Dragon voice recognition software and may include unintentional dictation errors       Delman Kitten, MD 02/17/19 0001

## 2019-02-16 NOTE — ED Triage Notes (Signed)
Patient presents to Emergency Department via [place] with complaints of weakness and "shaky".  Pt  Was in ED in Feb for same.  Pt c/o no pain but pressure in the front of head - now in  The back of head  Pt has new dx of HTN and on new meds for same within the month.  Daughter ar bedside during triage

## 2019-02-17 ENCOUNTER — Encounter: Payer: Self-pay | Admitting: *Deleted

## 2019-02-17 DIAGNOSIS — Z79890 Hormone replacement therapy (postmenopausal): Secondary | ICD-10-CM | POA: Diagnosis not present

## 2019-02-17 DIAGNOSIS — J984 Other disorders of lung: Secondary | ICD-10-CM | POA: Diagnosis present

## 2019-02-17 DIAGNOSIS — Z880 Allergy status to penicillin: Secondary | ICD-10-CM | POA: Diagnosis not present

## 2019-02-17 DIAGNOSIS — Z803 Family history of malignant neoplasm of breast: Secondary | ICD-10-CM | POA: Diagnosis not present

## 2019-02-17 DIAGNOSIS — I10 Essential (primary) hypertension: Secondary | ICD-10-CM | POA: Diagnosis present

## 2019-02-17 DIAGNOSIS — Z87891 Personal history of nicotine dependence: Secondary | ICD-10-CM | POA: Diagnosis not present

## 2019-02-17 DIAGNOSIS — Z85820 Personal history of malignant melanoma of skin: Secondary | ICD-10-CM | POA: Diagnosis not present

## 2019-02-17 DIAGNOSIS — Z7982 Long term (current) use of aspirin: Secondary | ICD-10-CM | POA: Diagnosis not present

## 2019-02-17 DIAGNOSIS — Z23 Encounter for immunization: Secondary | ICD-10-CM | POA: Diagnosis present

## 2019-02-17 DIAGNOSIS — H409 Unspecified glaucoma: Secondary | ICD-10-CM | POA: Diagnosis present

## 2019-02-17 DIAGNOSIS — R631 Polydipsia: Secondary | ICD-10-CM | POA: Diagnosis present

## 2019-02-17 DIAGNOSIS — E871 Hypo-osmolality and hyponatremia: Secondary | ICD-10-CM | POA: Diagnosis present

## 2019-02-17 DIAGNOSIS — E222 Syndrome of inappropriate secretion of antidiuretic hormone: Secondary | ICD-10-CM | POA: Diagnosis present

## 2019-02-17 DIAGNOSIS — M109 Gout, unspecified: Secondary | ICD-10-CM | POA: Diagnosis present

## 2019-02-17 LAB — URINALYSIS, COMPLETE (UACMP) WITH MICROSCOPIC
Bacteria, UA: NONE SEEN
Bilirubin Urine: NEGATIVE
Glucose, UA: >=500 mg/dL — AB
Hgb urine dipstick: NEGATIVE
KETONES UR: NEGATIVE mg/dL
Leukocytes,Ua: NEGATIVE
Nitrite: NEGATIVE
PROTEIN: NEGATIVE mg/dL
Specific Gravity, Urine: 1.003 — ABNORMAL LOW (ref 1.005–1.030)
pH: 8 (ref 5.0–8.0)

## 2019-02-17 LAB — SODIUM, URINE, RANDOM: Sodium, Ur: 60 mmol/L

## 2019-02-17 LAB — OSMOLALITY, URINE: Osmolality, Ur: 250 mOsm/kg — ABNORMAL LOW (ref 300–900)

## 2019-02-17 LAB — SODIUM
Sodium: 122 mmol/L — ABNORMAL LOW (ref 135–145)
Sodium: 121 mmol/L — ABNORMAL LOW (ref 135–145)
Sodium: 122 mmol/L — ABNORMAL LOW (ref 135–145)

## 2019-02-17 LAB — CHLORIDE, URINE, RANDOM: Chloride Urine: 47 mmol/L

## 2019-02-17 LAB — TSH: TSH: 4.917 u[IU]/mL — ABNORMAL HIGH (ref 0.350–4.500)

## 2019-02-17 LAB — HEMOGLOBIN A1C
Hgb A1c MFr Bld: 6.2 % — ABNORMAL HIGH (ref 4.8–5.6)
Mean Plasma Glucose: 131.24 mg/dL

## 2019-02-17 MED ORDER — ESTRADIOL 0.1 MG/GM VA CREA
1.0000 | TOPICAL_CREAM | VAGINAL | Status: DC
  Administered 2019-02-20: 1 via VAGINAL
  Filled 2019-02-17: qty 42.5

## 2019-02-17 MED ORDER — ONDANSETRON HCL 4 MG/2ML IJ SOLN: 4.0000 mg | Freq: Four times a day (QID) | INTRAMUSCULAR | Status: DC | PRN

## 2019-02-17 MED ORDER — TIMOLOL MALEATE 0.5 % OP SOLN: 1.0000 [drp] | Freq: Two times a day (BID) | OPHTHALMIC | Status: DC

## 2019-02-17 MED ORDER — ENOXAPARIN SODIUM 40 MG/0.4ML ~~LOC~~ SOLN
40.0000 mg | SUBCUTANEOUS | Status: DC
  Administered 2019-02-17 – 2019-02-20 (×4): 40 mg via SUBCUTANEOUS
  Filled 2019-02-17 (×4): qty 0.4

## 2019-02-17 MED ORDER — ACETAMINOPHEN 325 MG PO TABS
650.0000 mg | ORAL_TABLET | Freq: Four times a day (QID) | ORAL | Status: DC | PRN
  Filled 2019-02-17: qty 2

## 2019-02-17 MED ORDER — AMLODIPINE BESYLATE 5 MG PO TABS: 2.5000 mg | ORAL_TABLET | Freq: Every day | ORAL | Status: DC | PRN

## 2019-02-17 MED ORDER — ACETAMINOPHEN 650 MG RE SUPP: 650.0000 mg | Freq: Four times a day (QID) | RECTAL | Status: DC | PRN

## 2019-02-17 MED ORDER — PNEUMOCOCCAL VAC POLYVALENT 25 MCG/0.5ML IJ INJ: 0.5000 mL | INJECTION | INTRAMUSCULAR | Status: DC

## 2019-02-17 MED ORDER — DOCUSATE SODIUM 100 MG PO CAPS
100.0000 mg | ORAL_CAPSULE | Freq: Two times a day (BID) | ORAL | Status: DC
  Administered 2019-02-17 – 2019-02-20 (×7): 100 mg via ORAL
  Filled 2019-02-17 (×8): qty 1

## 2019-02-17 MED ORDER — PROPRANOLOL HCL ER 60 MG PO CP24
60.0000 mg | ORAL_CAPSULE | Freq: Every day | ORAL | Status: DC
  Administered 2019-02-17 – 2019-02-20 (×4): 60 mg via ORAL
  Filled 2019-02-17 (×5): qty 1

## 2019-02-17 MED ORDER — ONDANSETRON HCL 4 MG PO TABS: 4.0000 mg | ORAL_TABLET | Freq: Four times a day (QID) | ORAL | Status: DC | PRN

## 2019-02-17 MED ORDER — ASPIRIN EC 81 MG PO TBEC
81.0000 mg | DELAYED_RELEASE_TABLET | Freq: Every day | ORAL | Status: DC
  Administered 2019-02-17 – 2019-02-20 (×4): 81 mg via ORAL
  Filled 2019-02-17 (×4): qty 1

## 2019-02-17 MED ORDER — LATANOPROST 0.005 % OP SOLN
1.0000 [drp] | Freq: Every day | OPHTHALMIC | Status: DC
  Administered 2019-02-17 – 2019-02-19 (×3): 1 [drp] via OPHTHALMIC
  Filled 2019-02-17: qty 2.5

## 2019-02-17 MED ORDER — SODIUM CHLORIDE 0.9 % IV SOLN
INTRAVENOUS | Status: DC
  Administered 2019-02-17: 03:00:00 via INTRAVENOUS

## 2019-02-17 NOTE — H&P (Signed)
Cathy Newman is an 85 y.o. female.   Chief Complaint: Weakness HPI: The patient with past medical history of hyponatremia as well as glaucoma presents to the emergency department complaining of weakness.  Her daughter also reports some confusion.  She is used to this pattern when the patient sodium is low.  The patient has not had any seizure activity.  Due to her abnormal findings the emergency department staff call hospitalist service for further evaluation.  Past Medical History:  Diagnosis Date  . Cancer ([place])    melanoma on knee  . Glaucoma     Past Surgical History:  Procedure Laterality Date  . ABDOMINAL HYSTERECTOMY    . BREAST BIOPSY Right   . CYSTECTOMY    . RECTAL PROLAPSE REPAIR    . VAGINAL PROLAPSE REPAIR      Family History  Problem Relation Age of Onset  . Breast cancer Sister   . Bladder Cancer Neg Hx   . Kidney cancer Neg Hx    Social History:  reports that she has quit smoking. She has never used smokeless tobacco. She reports that she does not use drugs. No history on file for alcohol.  Allergies:  Allergies  Allergen Reactions  . Amoxicillin Hives  . Brinzolamide Other (See Comments)  . Other Other (See Comments)    MYCINS MYCINS    Medications Prior to Admission  Medication Sig Dispense Refill  . amLODipine (NORVASC) 2.5 MG tablet Take 1 tablet (2.5 mg total) by mouth daily as needed. If bp is >180 30 tablet 11  . aspirin EC 81 MG tablet Take 81 mg by mouth daily.     . bimatoprost (LUMIGAN) 0.01 % SOLN Place 1 drop into both eyes at bedtime.     Marland Kitchen ESTRACE VAGINAL 0.1 MG/GM vaginal cream USE 1 GRAM VAGINALLY TWO TIMES A WEEK AND APPLY TOPICALLY NIGHTLY TO URETHRAL MEATUS (Patient taking differently: Place 1 Applicatorful vaginally 2 (two) times a week. Apply nightly to urethral meatus) 42.5 g 0  . propranolol ER (INDERAL LA) 60 MG 24 hr capsule Take 60 mg by mouth daily.    . timolol (TIMOPTIC) 0.5 % ophthalmic solution Place 1 drop into both  eyes 2 (two) times daily.       Results for orders placed or performed during the hospital encounter of 02/16/19 (from the past 48 hour(s))  Basic metabolic panel     Status: Abnormal   Collection Time: 02/16/19  9:02 PM  Result Value Ref Range   Sodium 117 (LL) 135 - 145 mmol/L    Comment: CRITICAL RESULT CALLED TO, READ BACK BY AND VERIFIED WITH NOEL WEBSTER RN AT 2150 02/16/2019.MSS    Potassium 3.7 3.5 - 5.1 mmol/L   Chloride 86 (L) 98 - 111 mmol/L   CO2 20 (L) 22 - 32 mmol/L   Glucose, Bld 137 (H) 70 - 99 mg/dL   BUN 8 8 - 23 mg/dL   Creatinine, Ser 0.46 0.44 - 1.00 mg/dL   Calcium 8.5 (L) 8.9 - 10.3 mg/dL   GFR calc non Af Amer >60 >60 mL/min   GFR calc Af Amer >60 >60 mL/min   Anion gap 11 5 - 15    Comment: Performed at [facility], [address] [place], [place] [address]  CBC     Status: Abnormal   Collection Time: 02/16/19  9:02 PM  Result Value Ref Range   WBC 9.0 4.0 - 10.5 K/uL   RBC 4.12 3.87 - 5.11  MIL/uL   Hemoglobin 13.2 12.0 - 15.0 g/dL   HCT 36.5 36.0 - 46.0 %   MCV 88.6 80.0 - 100.0 fL   MCH 32.0 26.0 - 34.0 pg   MCHC 36.2 (H) 30.0 - 36.0 g/dL   RDW 11.5 11.5 - 15.5 %   Platelets 304 150 - 400 K/uL   nRBC 0.0 0.0 - 0.2 %    Comment: Performed at [facility], [address] [place], [place] [address]  Troponin I -     Status: None   Collection Time: 02/16/19  9:02 PM  Result Value Ref Range   Troponin I <0.03 <0.03 ng/mL    Comment: Performed at [facility], [address] [place], [place] [address]  Urinalysis, Complete w Microscopic     Status: Abnormal   Collection Time: 02/17/19 12:41 AM  Result Value Ref Range   Color, Urine STRAW (A) YELLOW   APPearance CLEAR (A) CLEAR   Specific Gravity, Urine 1.003 (L) 1.005 - 1.030   pH 8.0 5.0 - 8.0   Glucose, UA >=500 (A) NEGATIVE mg/dL   Hgb urine dipstick NEGATIVE NEGATIVE   Bilirubin Urine NEGATIVE NEGATIVE   Ketones, ur NEGATIVE NEGATIVE mg/dL   Protein, ur  NEGATIVE NEGATIVE mg/dL   Nitrite NEGATIVE NEGATIVE   Leukocytes,Ua NEGATIVE NEGATIVE   RBC / HPF 0-5 0 - 5 RBC/hpf   WBC, UA 0-5 0 - 5 WBC/hpf   Bacteria, UA NONE SEEN NONE SEEN   Squamous Epithelial / LPF 0-5 0 - 5    Comment: Performed at [facility], [address] [place], [place] [address]  Sodium, urine, random     Status: None   Collection Time: 02/17/19 12:41 AM  Result Value Ref Range   Sodium, Ur 60 mmol/L    Comment: Performed at [facility], [address] [place], [place] [address]  Chloride, urine, random     Status: None   Collection Time: 02/17/19 12:41 AM  Result Value Ref Range   Chloride Urine 47 mmol/L    Comment: Performed at [facility], [address] [place], [place] [address]  Osmolality, urine     Status: Abnormal   Collection Time: 02/17/19 12:41 AM  Result Value Ref Range   Osmolality, Ur 250 (L) 300 - 900 mOsm/kg    Comment: Performed at [facility], [address] [place], [place] [address]   No results found.  Review of Systems  Unable to perform ROS: Mental status change  Cardiovascular: Negative for chest pain.    Blood pressure (!) 163/79, pulse 69, temperature 98 F (36.7 C), temperature source Oral, resp. rate 18, height 5\' 1"  (1.549 m), weight 50.7 kg, SpO2 96 %. Physical Exam  Vitals reviewed. Constitutional: She is oriented to person, place, and time. She appears well-developed and well-nourished. No distress.  HENT:  Head: Normocephalic and atraumatic.  Mouth/Throat: Oropharynx is clear and moist.  Eyes: Pupils are equal, round, and reactive to light. Conjunctivae and EOM are normal. No scleral icterus.  Neck: Normal range of motion. Neck supple. No JVD present. No tracheal deviation present. No thyromegaly present.  Cardiovascular: Normal rate, regular rhythm and normal heart sounds. Exam reveals no gallop and no friction rub.  No murmur heard. Respiratory: Effort normal and breath  sounds normal.  GI: Soft. Bowel sounds are normal. She exhibits no distension. There is no abdominal tenderness.  Genitourinary:    Genitourinary Comments: Deferred   Musculoskeletal: Normal range of  motion.        General: No edema.  Lymphadenopathy:    She has no cervical adenopathy.  Neurological: She is alert and oriented to person, place, and time. No cranial nerve deficit. She exhibits normal muscle tone.  Skin: Skin is warm and dry. No rash noted. No erythema.  Psychiatric: She has a normal mood and affect. Her behavior is normal. Judgment and thought content normal.     Assessment/Plan This is a 85 year old female admitted for hyponatremia. 1.  Hyponatremia: Etiology unclear.  Differential diagnosis includes Addison's disease, central diabetes insipidus the patient has no symptoms of CHF cirrhosis or potomania.  No no indication of SIADH at this time.  Repeat sodium checks to avoid rapid increase in subsequent pontine myelinolysis.  Consult nephrology 2.  Confusion: Secondary to above.  Gradually increase sodium and observe for improvement in mental status. 3.  Hypertension: Acceptable for age; continue amlodipine 4.  Glaucoma: Open-angle; continue eyedrops 5.  DVT prophylaxis: Lovenox 6.  GI prophylaxis: None The patient is a full code.  Time spent on admission orders and patient care approximately 45 minutes  Harrie Foreman, MD 02/17/2019, 2:16 AM

## 2019-02-17 NOTE — ED Notes (Signed)
ED TO INPATIENT HANDOFF REPORT  ED Nurse Name and Phone #: Ena Dawley [phone]  S Name/Age/Gender Cathy Newman 85 y.o. female Room/Bed: ED05A/ED05A  Code Status   Code Status: Prior  Home/SNF/Other Home Patient oriented to: self, place and situation Is this baseline? No   Triage Complete: Triage complete  Chief Complaint weakness  Triage Note Patient presents to Emergency Department via [place] with complaints of weakness and "shaky".  Pt  Was in ED in Feb for same.  Pt c/o no pain but pressure in the front of head - now in  The back of head  Pt has new dx of HTN and on new meds for same within the month.  Daughter ar bedside during triage   Allergies Allergies  Allergen Reactions  . Amoxicillin Hives  . Brinzolamide Other (See Comments)  . Other Other (See Comments)    MYCINS MYCINS    Level of Care/Admitting Diagnosis ED Disposition    ED Disposition Condition [facility] Area: [place] [[id number]]  Level of Care: Med-Surg [16]  Diagnosis: Hyponatremia [540086]  Admitting Physician: Harrie Foreman [[id number]]  Attending Physician: Harrie Foreman [[id number]]  Estimated length of stay: past midnight tomorrow  Certification:: I certify this patient will need inpatient services for at least 2 midnights  PT Class (Do Not Modify): Inpatient [101]  PT Acc Code (Do Not Modify): Private [1]       B Medical/Surgery History Past Medical History:  Diagnosis Date  . Cancer ([place])    melanoma on knee  . Glaucoma    Past Surgical History:  Procedure Laterality Date  . ABDOMINAL HYSTERECTOMY    . BREAST BIOPSY Right   . CYSTECTOMY    . RECTAL PROLAPSE REPAIR    . VAGINAL PROLAPSE REPAIR       A IV Location/Drains/Wounds Patient Lines/Drains/Airways Status   Active Line/Drains/Airways    Name:   Placement date:   Placement time:   Site:   Days:   Peripheral IV 02/16/19 Left Forearm   02/16/19    2040     Forearm   1          Intake/Output Last 24 hours  Intake/Output Summary (Last 24 hours) at 02/17/2019 0124 Last data filed at 02/16/2019 2242 Gross per 24 hour  Intake 50 ml  Output -  Net 50 ml    Labs/Imaging Results for orders placed or performed during the hospital encounter of 02/16/19 (from the past 48 hour(s))  Basic metabolic panel     Status: Abnormal   Collection Time: 02/16/19  9:02 PM  Result Value Ref Range   Sodium 117 (LL) 135 - 145 mmol/L    Comment: CRITICAL RESULT CALLED TO, READ BACK BY AND VERIFIED WITH Sevanna Ballengee WEBSTER RN AT 2150 02/16/2019.MSS    Potassium 3.7 3.5 - 5.1 mmol/L   Chloride 86 (L) 98 - 111 mmol/L   CO2 20 (L) 22 - 32 mmol/L   Glucose, Bld 137 (H) 70 - 99 mg/dL   BUN 8 8 - 23 mg/dL   Creatinine, Ser 0.46 0.44 - 1.00 mg/dL   Calcium 8.5 (L) 8.9 - 10.3 mg/dL   GFR calc non Af Amer >60 >60 mL/min   GFR calc Af Amer >60 >60 mL/min   Anion gap 11 5 - 15    Comment: Performed at [facility], [address] [place], [place] [address]  CBC     Status: Abnormal  Collection Time: 02/16/19  9:02 PM  Result Value Ref Range   WBC 9.0 4.0 - 10.5 K/uL   RBC 4.12 3.87 - 5.11 MIL/uL   Hemoglobin 13.2 12.0 - 15.0 g/dL   HCT 36.5 36.0 - 46.0 %   MCV 88.6 80.0 - 100.0 fL   MCH 32.0 26.0 - 34.0 pg   MCHC 36.2 (H) 30.0 - 36.0 g/dL   RDW 11.5 11.5 - 15.5 %   Platelets 304 150 - 400 K/uL   nRBC 0.0 0.0 - 0.2 %    Comment: Performed at [facility], [address] [place], [place] [address]  Troponin I -     Status: None   Collection Time: 02/16/19  9:02 PM  Result Value Ref Range   Troponin I <0.03 <0.03 ng/mL    Comment: Performed at [facility], [address] [place], [place] [address]  Urinalysis, Complete w Microscopic     Status: Abnormal   Collection Time: 02/17/19 12:41 AM  Result Value Ref Range   Color, Urine STRAW (A) YELLOW   APPearance CLEAR (A) CLEAR   Specific Gravity, Urine 1.003 (L) 1.005 - 1.030    pH 8.0 5.0 - 8.0   Glucose, UA >=500 (A) NEGATIVE mg/dL   Hgb urine dipstick NEGATIVE NEGATIVE   Bilirubin Urine NEGATIVE NEGATIVE   Ketones, ur NEGATIVE NEGATIVE mg/dL   Protein, ur NEGATIVE NEGATIVE mg/dL   Nitrite NEGATIVE NEGATIVE   Leukocytes,Ua NEGATIVE NEGATIVE   RBC / HPF 0-5 0 - 5 RBC/hpf   WBC, UA 0-5 0 - 5 WBC/hpf   Bacteria, UA NONE SEEN NONE SEEN   Squamous Epithelial / LPF 0-5 0 - 5    Comment: Performed at [facility], [address] [place], [place] [address]  Sodium, urine, random     Status: None   Collection Time: 02/17/19 12:41 AM  Result Value Ref Range   Sodium, Ur 60 mmol/L    Comment: Performed at [facility], [address] [place], [place] [address]  Chloride, urine, random     Status: None   Collection Time: 02/17/19 12:41 AM  Result Value Ref Range   Chloride Urine 47 mmol/L    Comment: Performed at [facility], [address] [place], [place] [address]   No results found.  Pending Labs Unresulted Labs (From admission, onward)    Start     Ordered   02/16/19 2248  Osmolality, urine  Once,   STAT     02/16/19 2247   Signed and Held  Creatinine, serum  (enoxaparin (LOVENOX)    CrCl >/= 30 ml/min)  Weekly,   R    Comments:  while on enoxaparin therapy    Signed and Held   Signed and Held  TSH  Add-on,   R     Signed and Held   Signed and Held  Sodium  Now then every 6 hours,   R     Signed and Held          Vitals/Pain Today's Vitals   02/16/19 2040 02/16/19 2051 02/16/19 2052 02/16/19 2225  BP:  (!) 158/88  (!) 165/86  Pulse:  66  66  Resp:  (!) 21  16  Temp:  97.8 F (36.6 C)    TempSrc:  Oral    SpO2: 96% 100%  98%  Weight:   50.7 kg   Height:   5\' 1"  (1.549 m)   PainSc:   0-No pain  0-No pain    Isolation Precautions No active isolations  Medications Medications  sodium chloride flush (NS) 0.9 % injection 3 mL (has no administration in time range)  sodium chloride 0.9 % bolus 1,000 mL (0  mLs Intravenous Stopped 02/16/19 2242)    Mobility walks Low fall risk   Focused Assessments Cardiac Assessment Handoff:  Cardiac Rhythm: Normal sinus rhythm Lab Results  Component Value Date   TROPONINI <0.03 02/16/2019   No results found for: DDIMER Does the Patient currently have chest pain? No     R Recommendations: See Admitting Provider Note  Report given to:   Additional Notes: Daughter at bedside

## 2019-02-17 NOTE — ED Notes (Signed)
This tech in to pt room due to call light ringing, pt assisted to restroom by this tech and daughter, urine specimen obtained and pt as well as daughter provided with warm blanket, pt bed in lowest position, call light within reach and pt advised that should she need anything to use her call bell and staff would be in to assist her

## 2019-02-17 NOTE — Consult Note (Signed)
Date: 02/17/2019                  Patient Name:  Cathy Newman  MRN: [id number]  DOB: 02/22/1925  Age / Sex: 85 y.o., female         PCP: Idelle Crouch, MD                 Service Requesting Consult: IM/ Henreitta Leber, MD                 Reason for Consult: hyponatremia            History of Present Illness: Patient is a 85 y.o. female with medical problems of glaucoma, chronic hyponatremia, who was [facility] to [facility] on 02/16/2019 for evaluation of weakness.  Evaluation in the emergency room showed acute drop in sodium to 117.With normal saline infusion, and has improved 122 this morning.  Recent noncontrast CT of the chest shows well-defined 15 x 5 x 16 mm soft tissue density in the left lung apex abutting the aortic arch.  Atelectasis versus scarring versus neoplasm. Patient has chronic hyponatremia.  Her sodium level has been low as far back as 2013 when it was 129. On February 09, 2019, sodium was 130 Had three spells Tue-wed-thu- lasting 5-10 min ;  felt like pressure in back of head- Shaking of hands-  Water intake - patient states she drinks a lot of liquids- She drinks at least eight 12 ounce cups of water, coffee with each meal, juice with breakfast and beer on weekends. She has no leg edema.  No shortness of breath.   Medications: Outpatient medications: Medications Prior to Admission  Medication Sig Dispense Refill Last Dose  . amLODipine (NORVASC) 2.5 MG tablet Take 1 tablet (2.5 mg total) by mouth daily as needed. If bp is >180 30 tablet 11 02/16/2019 at Unknown time  . aspirin EC 81 MG tablet Take 81 mg by mouth daily.    02/16/2019 at Unknown time  . bimatoprost (LUMIGAN) 0.01 % SOLN Place 1 drop into both eyes at bedtime.    unknown at unknown  . ESTRACE VAGINAL 0.1 MG/GM vaginal cream USE 1 GRAM VAGINALLY TWO TIMES A WEEK AND APPLY TOPICALLY NIGHTLY TO URETHRAL MEATUS (Patient taking differently: Place 1 Applicatorful vaginally 2 (two) times a week. Apply nightly  to urethral meatus) 42.5 g 0 Past Week at Unknown time  . propranolol ER (INDERAL LA) 60 MG 24 hr capsule Take 60 mg by mouth daily.   02/16/2019 at Unknown time  . timolol (TIMOPTIC) 0.5 % ophthalmic solution Place 1 drop into both eyes 2 (two) times daily.    Not Taking at Unknown time    Current medications: Current Facility-Administered Medications  Medication Dose Route Frequency Provider Last Rate Last Dose  . 0.9 %  sodium chloride infusion   Intravenous Continuous Henreitta Leber, MD 50 mL/hr at 02/17/19 1104    . acetaminophen (TYLENOL) tablet 650 mg  650 mg Oral Q6H PRN Harrie Foreman, MD       Or  . acetaminophen (TYLENOL) suppository 650 mg  650 mg Rectal Q6H PRN Harrie Foreman, MD      . amLODipine (NORVASC) tablet 2.5 mg  2.5 mg Oral Daily PRN Harrie Foreman, MD      . aspirin EC tablet 81 mg  81 mg Oral Daily Harrie Foreman, MD   81 mg at 02/17/19 0750  . docusate sodium (COLACE) capsule 100  mg  100 mg Oral BID Harrie Foreman, MD   100 mg at 02/17/19 0750  . enoxaparin (LOVENOX) injection 40 mg  40 mg Subcutaneous Q24H Harrie Foreman, MD   40 mg at 02/17/19 0751  . [START ON 02/19/2019] estradiol (ESTRACE) vaginal cream 1 Applicatorful  1 Applicatorful Vaginal Once per day on Mon Thu Diamond, Michael S, MD      . latanoprost (XALATAN) 0.005 % ophthalmic solution 1 drop  1 drop Both Eyes QHS Harrie Foreman, MD      . ondansetron [facility]) tablet 4 mg  4 mg Oral Q6H PRN Harrie Foreman, MD       Or  . ondansetron [facility]) injection 4 mg  4 mg Intravenous Q6H PRN Harrie Foreman, MD      . Derrill Memo ON 02/18/2019] pneumococcal 23 valent vaccine (PNU-IMMUNE) injection 0.5 mL  0.5 mL Intramuscular Tomorrow-1000 Harrie Foreman, MD      . propranolol ER (INDERAL LA) 24 hr capsule 60 mg  60 mg Oral Daily Harrie Foreman, MD   60 mg at 02/17/19 0906  . sodium chloride flush (NS) 0.9 % injection 3 mL  3 mL Intravenous Once Harrie Foreman, MD           Allergies: Allergies  Allergen Reactions  . Amoxicillin Hives  . Brinzolamide Other (See Comments)  . Other Other (See Comments)    Alvan Dame      Past Medical History: Past Medical History:  Diagnosis Date  . Cancer ([place])    melanoma on knee  . Glaucoma      Past Surgical History: Past Surgical History:  Procedure Laterality Date  . ABDOMINAL HYSTERECTOMY    . BREAST BIOPSY Right   . CYSTECTOMY    . RECTAL PROLAPSE REPAIR    . VAGINAL PROLAPSE REPAIR       Family History: Family History  Problem Relation Age of Onset  . Breast cancer Sister   . Bladder Cancer Neg Hx   . Kidney cancer Neg Hx      Social History: Social History   Socioeconomic History  . Marital status: Widowed    Spouse name: Not on file  . Number of children: 3  . Years of education: Not on file  . Highest education level: Not on file  Occupational History  . Not on file  Social Needs  . Financial resource strain: Not hard at all  . Food insecurity:    Worry: Never true    Inability: Never true  . Transportation needs:    Medical: No    Non-medical: No  Tobacco Use  . Smoking status: Former Research scientist (life sciences)  . Smokeless tobacco: Never Used  Substance and Sexual Activity  . Alcohol use: Not on file    Comment: occasionally  . Drug use: Never  . Sexual activity: Not on file  Lifestyle  . Physical activity:    Days per week: 7 days    Minutes per session: 40 min  . Stress: Not on file  Relationships  . Social connections:    Talks on phone: Not on file    Gets together: Not on file    Attends religious service: Not on file    Active member of club or organization: Not on file    Attends meetings of clubs or organizations: Not on file    Relationship status: Not on file  . Intimate partner violence:    Fear of current or ex  partner: Not on file    Emotionally abused: Not on file    Physically abused: Not on file    Forced sexual activity: Not on file  Other Topics  Concern  . Not on file  Social History Narrative  . Not on file     Review of Systems: Gen: Denies any fevers or chills, no weight loss HEENT: No vision or hearing problems CV: No chest pain or shortness of breath reported Resp: No cough, sputum or hemoptysis ZJ:QBHALPFX is good.  No nausea vomiting or diarrhea GU : No problem with voiding.  Gets up 1 time at night MS: Able to ambulate without any problem.  Able to drive Derm:  No complaints Psych:No complaints Heme: No complaints Neuro: No complaints EndocrineNo complaints  Vital Signs: Blood pressure (!) 148/59, pulse 65, temperature 98 F (36.7 C), temperature source Oral, resp. rate 20, height 5\' 1"  (1.549 m), weight 50.7 kg, SpO2 94 %.   Intake/Output Summary (Last 24 hours) at 02/17/2019 1143 Last data filed at 02/17/2019 1104 Gross per 24 hour  Intake 769.33 ml  Output -  Net 769.33 ml    Weight trends: [employer]   02/16/19 2052  Weight: 50.7 kg    Physical Exam: General:  No acute distress, sitting up in the chair  HEENT Anicteric, moist oral mucous membrane  Neck:  Supple, no mass  Lungs: Clear to auscultation, normal breathing effort on room air  Heart::  Regular rate and rhythm, no rub or gallop  Abdomen: Soft, nontender  Extremities:  No peripheral edema  Neurologic: Alert, oriented, some forgetfulness  Skin: No acute rashes    Lab results: Basic Metabolic Panel: Recent Labs  Lab 02/16/19 2102 02/17/19 0606  NA 117* 122*  K 3.7  --   CL 86*  --   CO2 20*  --   GLUCOSE 137*  --   BUN 8  --   CREATININE 0.46  --   CALCIUM 8.5*  --     Liver Function Tests: No results for input(s): AST, ALT, ALKPHOS, BILITOT, PROT, ALBUMIN in the last 168 hours. No results for input(s): LIPASE, AMYLASE in the last 168 hours. No results for input(s): AMMONIA in the last 168 hours.  CBC: Recent Labs  Lab 02/16/19 2102  WBC 9.0  HGB 13.2  HCT 36.5  MCV 88.6  PLT 304    Cardiac  Enzymes: Recent Labs  Lab 02/16/19 2102  TROPONINI <0.03    BNP: Invalid input(s): POCBNP  CBG: No results for input(s): GLUCAP in the last 168 hours.  Microbiology: No results found for this or any previous visit (from the past 720 hour(s)).   Coagulation Studies: No results for input(s): LABPROT, INR in the last 72 hours.  Urinalysis: Recent Labs    02/17/19 0041  COLORURINE STRAW*  LABSPEC 1.003*  PHURINE 8.0  GLUCOSEU >=500*  HGBUR NEGATIVE  BILIRUBINUR NEGATIVE  KETONESUR NEGATIVE  PROTEINUR NEGATIVE  NITRITE NEGATIVE  LEUKOCYTESUR NEGATIVE        Imaging:  No results found.   Assessment & Plan: Pt is a 85 y.o. female with chronic hyponatremia, gout,, recent diagnosis of lung mass  was admitted on 02/16/2019 with generalized weakness and is found to have acute hyponatremia.   1.  Hyponatremia Baseline sodium is 130 on February 09, 2019 Admission sodium 117 which improved to 122 this morning Urine studies show urinalysis specific gravity 1.003, urine osmolality 250, urine sodium 60 Other work-up includes TSH that is mildly  elevated.  Patient had a CT scan of the chest about 2 weeks ago which showed possible lung mass but further work-up was recommended.  Given the history of hyponatremia for at least 7 years, unlikely it is related to lung mass.  Plan to institute fluid restriction of about 1000 to 1200 cc/day.  Patient and her daughter were instructed regarding this.  We will obtain SPEP, UPEP, repeat urine studies in the morning.  Patient feels she has improved symptomatically.  Further recommendations as hospital course progresses.  Given glucosuria, will obtain hemoglobin A1c also.     LOS: 0 Malva Diesing Candiss Norse 3/14/202011:43 AM  [place], [place]  Note: This note was prepared with Dragon dictation. Any transcription errors are unintentional

## 2019-02-17 NOTE — Progress Notes (Signed)
[place] [facility] [place] NAME: Cathy Newman    MR#:  [id number]  DATE OF BIRTH:  11/13/1925  SUBJECTIVE:   Pt. Here due to weakness/AMS and noted to be Hyponatremic.  Sodium has improved since yesterday.    REVIEW OF SYSTEMS:    Review of Systems  Constitutional: Negative for chills and fever.  HENT: Negative for congestion and tinnitus.   Eyes: Negative for blurred vision and double vision.  Respiratory: Negative for cough, shortness of breath and wheezing.   Cardiovascular: Negative for chest pain, orthopnea and PND.  Gastrointestinal: Negative for abdominal pain, diarrhea, nausea and vomiting.  Genitourinary: Negative for dysuria and hematuria.  Neurological: Negative for dizziness, sensory change and focal weakness.  All other systems reviewed and are negative.   Nutrition: Regular Tolerating Diet: Yes Tolerating PT: Await Eval.   DRUG ALLERGIES:   Allergies  Allergen Reactions  . Amoxicillin Hives  . Brinzolamide Other (See Comments)  . Other Other (See Comments)    MYCINS MYCINS    VITALS:  Blood pressure (!) 148/59, pulse 65, temperature 98 F (36.7 C), temperature source Oral, resp. rate 20, height 5\' 1"  (1.549 m), weight 50.7 kg, SpO2 94 %.  PHYSICAL EXAMINATION:   Physical Exam  GENERAL:  85 y.o.-year-old patient lying in bed in no acute distress.  EYES: Pupils equal, round, reactive to light and accommodation. No scleral icterus. Extraocular muscles intact.  HEENT: Head atraumatic, normocephalic. Oropharynx and nasopharynx clear.  NECK:  Supple, no jugular venous distention. No thyroid enlargement, no tenderness.  LUNGS: Normal breath sounds bilaterally, no wheezing, rales, rhonchi. No use of accessory muscles of respiration.  CARDIOVASCULAR: S1, S2 normal. No murmurs, rubs, or gallops.  ABDOMEN: Soft, nontender, nondistended. Bowel sounds present. No organomegaly or mass.  EXTREMITIES: No cyanosis, clubbing or  edema b/l.    NEUROLOGIC: Cranial nerves II through XII are intact. No focal Motor or sensory deficits b/l.   PSYCHIATRIC: The patient is alert and oriented x 3.  SKIN: No obvious rash, lesion, or ulcer.    LABORATORY PANEL:   CBC Recent Labs  Lab 02/16/19 2102  WBC 9.0  HGB 13.2  HCT 36.5  PLT 304   ------------------------------------------------------------------------------------------------------------------  Chemistries  Recent Labs  Lab 02/16/19 2102  02/17/19 1232  NA 117*   < > 121*  K 3.7  --   --   CL 86*  --   --   CO2 20*  --   --   GLUCOSE 137*  --   --   BUN 8  --   --   CREATININE 0.46  --   --   CALCIUM 8.5*  --   --    < > = values in this interval not displayed.   ------------------------------------------------------------------------------------------------------------------  Cardiac Enzymes Recent Labs  Lab 02/16/19 2102  TROPONINI <0.03   ------------------------------------------------------------------------------------------------------------------  RADIOLOGY:  No results found.   ASSESSMENT AND PLAN:   85 yo female w/ hx of hypertension, glaucoma, lung mass who presents to the hospital due to weakness and altered mental status and noted to be hyponatremic  1.  Acute hyponatremia-this is recurrent in nature as patient was recently hospitalized for similar reasons. - Appreciate nephrology input and patient apparently has increasing intake of fluids and they are recommending fluid restriction for now.  Taken off IV fluids.  Sodium has improved from 1 17-1 22 today.  Unlikely this is related to the underlying lung mass.  2.  Lung mass- patient was noted to this on CT scan on admission during her previous hospitalization.  It is a well-circumscribed mass unlikely this is neoplasm.  Patient was referred as an outpatient to get a PET scan but she currently refuses to get that done and does not want any treatment.  Conservative management for  now.  3.  Glaucoma-continue latanoprost eyedrops.  4. Essential HTN - cont. Propranolol, Norvasc.     All the records are reviewed and case discussed with Care Management/Social Worker. Management plans discussed with the patient, family and they are in agreement.  CODE STATUS: Full code  DVT Prophylaxis: Lovenox  TOTAL TIME TAKING CARE OF THIS PATIENT: 30 minutes.   POSSIBLE D/C IN 2-3 DAYS, DEPENDING ON CLINICAL CONDITION.   Henreitta Leber M.D on 02/17/2019 at 1:23 PM  Between 7am to 6pm - Pager - [phone]  After 6pm go to [email or web address] - Proofreader  [facility] [facility]  Office  [phone]  CC: Primary care physician; Idelle Crouch, MD

## 2019-02-17 NOTE — Progress Notes (Signed)
Nephro consult done. IVF's stopped. Fluid restriction began at lunch time. Pt and family instructed of need for urine speciment. Last sodium level was 121. Pt alert/oriented. Ambulates to BR with SB+/1+ assist. Up in chair x 2 hours and tolerating well. Family very active/supportive of pt.

## 2019-02-18 LAB — BASIC METABOLIC PANEL
Anion gap: 6 (ref 5–15)
Anion gap: 8 (ref 5–15)
BUN: 12 mg/dL (ref 8–23)
BUN: 13 mg/dL (ref 8–23)
CO2: 22 mmol/L (ref 22–32)
CO2: 24 mmol/L (ref 22–32)
CREATININE: 0.57 mg/dL (ref 0.44–1.00)
Calcium: 8.5 mg/dL — ABNORMAL LOW (ref 8.9–10.3)
Calcium: 8.5 mg/dL — ABNORMAL LOW (ref 8.9–10.3)
Chloride: 92 mmol/L — ABNORMAL LOW (ref 98–111)
Chloride: 93 mmol/L — ABNORMAL LOW (ref 98–111)
Creatinine, Ser: 0.49 mg/dL (ref 0.44–1.00)
GFR calc non Af Amer: >60 mL/min (ref >60)
GFR calc non Af Amer: >60 mL/min (ref >60)
GLUCOSE: 162 mg/dL — AB (ref 70–99)
Glucose, Bld: 115 mg/dL — ABNORMAL HIGH (ref 70–99)
POTASSIUM: 4 mmol/L (ref 3.5–5.1)
Potassium: 4 mmol/L (ref 3.5–5.1)
SODIUM: 122 mmol/L — AB (ref 135–145)
SODIUM: 123 mmol/L — AB (ref 135–145)

## 2019-02-18 LAB — SODIUM
Sodium: 126 mmol/L — ABNORMAL LOW (ref 135–145)
Sodium: 124 mmol/L — ABNORMAL LOW (ref 135–145)
Sodium: 126 mmol/L — ABNORMAL LOW (ref 135–145)

## 2019-02-18 LAB — CHLORIDE, URINE, RANDOM: CHLORIDE URINE: 39 mmol/L

## 2019-02-18 LAB — SODIUM, URINE, RANDOM: Sodium, Ur: 37 mmol/L

## 2019-02-18 LAB — OSMOLALITY, URINE: Osmolality, Ur: 285 mOsm/kg — ABNORMAL LOW (ref 300–900)

## 2019-02-18 MED ORDER — SODIUM CHLORIDE 0.9% FLUSH
3.0000 mL | Freq: Two times a day (BID) | INTRAVENOUS | Status: DC
  Administered 2019-02-18 – 2019-02-20 (×5): 3 mL via INTRAVENOUS

## 2019-02-18 MED ORDER — AMLODIPINE BESYLATE 5 MG PO TABS: 2.5000 mg | ORAL_TABLET | Freq: Every day | ORAL | Status: DC | PRN

## 2019-02-18 MED ORDER — ENSURE ENLIVE PO LIQD
237.0000 mL | Freq: Two times a day (BID) | ORAL | Status: DC
  Administered 2019-02-18 – 2019-02-20 (×3): 237 mL via ORAL

## 2019-02-18 MED ORDER — PNEUMOCOCCAL VAC POLYVALENT 25 MCG/0.5ML IJ INJ
0.5000 mL | INJECTION | Freq: Once | INTRAMUSCULAR | Status: AC
  Administered 2019-02-18: 08:00:00 0.5 mL via INTRAMUSCULAR
  Filled 2019-02-18: qty 0.5

## 2019-02-18 MED ORDER — TOLVAPTAN 15 MG PO TABS
15.0000 mg | ORAL_TABLET | ORAL | Status: DC
  Administered 2019-02-18 – 2019-02-19 (×2): 15 mg via ORAL
  Filled 2019-02-18 (×3): qty 1

## 2019-02-18 NOTE — Plan of Care (Signed)
IN discussing POC with pt. She stated fluid restriction is something she "will have to get used to" and that she "typically drinks a lot more than that". Following discussion with pt. 2 cups have been placed in room one for a.m. and one for p.m. The cups will be used to fill with half a days fluid allowance. Pt will evaluate if this is helpful to her over the next 24 hours

## 2019-02-18 NOTE — Progress Notes (Signed)
Sodium level trending downward from 126 to 122 this shift thus far. Felt weak coming back from BR this morning with vs's stable. Slightly more noted impaired memory recall. Started tolvaptan 15 mg po with oral and written education given to family. Eating well; wanting to drink more than 1200 ml fluid restriction with education/reinforcement done. Advised supplements are not counted. Strict I and O education done with pt and dgt with verbalized understanding-cap in toilet in the toilet.

## 2019-02-18 NOTE — Progress Notes (Addendum)
Dgt called and reported pt feeling weak after coming back from BR. VSS. Reports she is feeling ok now. Reports she wants something more to drink with pt having coffee, milk and juice already. Reviewed fluid restriction with pt/family. Oral care kit prepared and left at bedside. Suggested gum or hard candy as way to keep mouth moist. Dr. Candiss Norse in and updated. Sodium trended down at last check- 124. Safety education done with pt and family to let staff accompany pt in and OOB and repeated back.

## 2019-02-18 NOTE — TOC Progression Note (Signed)
Transition of Care [facility]) - Progression Note    Patient Details  Name: Cathy Newman MRN: [id number] Date of Birth: 1925/05/01  Transition of Care [facility]) CM/SW Contact  Latanya Maudlin, RN Phone Number: 02/18/2019, 8:29 AM  Clinical Narrative:  RNCM was consulted for possible home health needs since patient is recent re admission. Patient tells me she is very independent at discharge and requires no assistance. She lives in a single level home and requires no DME although she has a rolling walker and cane in the home. Per documentation and the patient she did not qualify for home health last admission since she is not homebound she just needed someone to check blood pressures. She was referred to silver sneakers program. Patient consistently tells me she does not need any level of assistance. RNCM team will follow for any potential needs.       Expected Discharge Plan: Home/Self Care Barriers to Discharge: No Barriers Identified  Expected Discharge Plan and Services Expected Discharge Plan: Home/Self Care Discharge Planning Services: CM Consult   Living arrangements for the past 2 months: Single Family Home                           Social Determinants of Health (SDOH) Interventions    Readmission Risk Interventions 30 Day Unplanned Readmission Risk Score     ED to Hosp-Admission (Current) from 02/16/2019 in [place] (1C)  30 Day Unplanned Readmission Risk Score (%)  16 Filed at 02/18/2019 0801     This score is the patient's risk of an unplanned readmission within 30 days of being discharged (0 -100%). The score is based on dignosis, age, lab data, medications, orders, and past utilization.   Low:  0-14.9   Medium: 15-21.9   High: 22-29.9   Extreme: 30 and above       No flowsheet data found.

## 2019-02-18 NOTE — Progress Notes (Signed)
[place] at [place] NAME: Cathy Newman    MR#:  [id number]  DATE OF BIRTH:  1925-02-13  SUBJECTIVE:   Pt. Here due to weakness/AMS and noted to be Hyponatremic.  Sodium is improving with fluid restriction and pt. Feels better. Family at bedside.    REVIEW OF SYSTEMS:    Review of Systems  Constitutional: Negative for chills and fever.  HENT: Negative for congestion and tinnitus.   Eyes: Negative for blurred vision and double vision.  Respiratory: Negative for cough, shortness of breath and wheezing.   Cardiovascular: Negative for chest pain, orthopnea and PND.  Gastrointestinal: Negative for abdominal pain, diarrhea, nausea and vomiting.  Genitourinary: Negative for dysuria and hematuria.  Neurological: Negative for dizziness, sensory change and focal weakness.  All other systems reviewed and are negative.   Nutrition: Regular Tolerating Diet: Yes Tolerating PT: pt. Is ambulatory  DRUG ALLERGIES:   Allergies  Allergen Reactions  . Amoxicillin Hives  . Brinzolamide Other (See Comments)  . Other Other (See Comments)    MYCINS MYCINS    VITALS:  Blood pressure (!) 139/58, pulse 61, temperature 97.6 F (36.4 C), temperature source Oral, resp. rate 20, height 5\' 1"  (1.549 m), weight 66.7 kg, SpO2 95 %.  PHYSICAL EXAMINATION:   Physical Exam  GENERAL:  85 y.o.-year-old patient lying in bed in no acute distress.  EYES: Pupils equal, round, reactive to light and accommodation. No scleral icterus. Extraocular muscles intact.  HEENT: Head atraumatic, normocephalic. Oropharynx and nasopharynx clear.  NECK:  Supple, no jugular venous distention. No thyroid enlargement, no tenderness.  LUNGS: Normal breath sounds bilaterally, no wheezing, rales, rhonchi. No use of accessory muscles of respiration.  CARDIOVASCULAR: S1, S2 normal. No murmurs, rubs, or gallops.  ABDOMEN: Soft, nontender, nondistended. Bowel sounds present. No  organomegaly or mass.  EXTREMITIES: No cyanosis, clubbing or edema b/l.    NEUROLOGIC: Cranial nerves II through XII are intact. No focal Motor or sensory deficits b/l.   PSYCHIATRIC: The patient is alert and oriented x 3.  SKIN: No obvious rash, lesion, or ulcer.    LABORATORY PANEL:   CBC Recent Labs  Lab 02/16/19 2102  WBC 9.0  HGB 13.2  HCT 36.5  PLT 304   ------------------------------------------------------------------------------------------------------------------  Chemistries  Recent Labs  Lab 02/18/19 0013  02/18/19 1043  NA 123*   < > 124*  K 4.0  --   --   CL 93*  --   --   CO2 22  --   --   GLUCOSE 115*  --   --   BUN 13  --   --   CREATININE 0.57  --   --   CALCIUM 8.5*  --   --    < > = values in this interval not displayed.   ------------------------------------------------------------------------------------------------------------------  Cardiac Enzymes Recent Labs  Lab 02/16/19 2102  TROPONINI <0.03   ------------------------------------------------------------------------------------------------------------------  RADIOLOGY:  No results found.   ASSESSMENT AND PLAN:   85 yo female w/ hx of hypertension, glaucoma, lung mass who presents to the hospital due to weakness and altered mental status and noted to be hyponatremic  1.  Acute hyponatremia-this is recurrent in nature as patient was recently hospitalized for similar reasons. - Appreciate nephrology input and patient apparently has increasing intake of fluids and they are recommending fluid restriction for now.  - cont. Fluid restriction and sodium is improving and up to 124 today and will cont.  To monitor.  - clinically asymptomatic now.   2.  Lung mass- patient was noted on CT scan on admission during her previous hospitalization.  It is a well-circumscribed mass unlikely this is neoplasm.  Patient was referred as an outpatient to get a PET scan but she currently refuses to get that  done and does not want any treatment.  Conservative management for now.  3.  Glaucoma-continue latanoprost eyedrops.  4. Essential HTN - cont. Propranolol, Norvasc.   Possible d/c home tomorrow if sodium continues to improve.    All the records are reviewed and case discussed with Care Management/Social Worker. Management plans discussed with the patient, family and they are in agreement.  CODE STATUS: Full code  DVT Prophylaxis: Lovenox  TOTAL TIME TAKING CARE OF THIS PATIENT: 25 minutes.   POSSIBLE D/C IN 1-2 DAYS, DEPENDING ON CLINICAL CONDITION.   Henreitta Leber M.D on 02/18/2019 at 12:31 PM  Between 7am to 6pm - Pager - [phone]  After 6pm go to [email or web address] - Proofreader  [facility]  Office  [phone]  CC: Primary care physician; Idelle Crouch, MD

## 2019-02-18 NOTE — Progress Notes (Signed)
[facility], [place] 02/18/19  Subjective:   Patient's sodium level improved to 126 overnight but dropped to 124 and then to 122 this morning.  Patient is getting confused.  She had a dizzy spell earlier today.  Patient's family is in the room with her. Patient is still trying to drink more liquids.  Not able to follow the fluid restriction.  Objective:  Vital signs in last 24 hours:  Temp:  [97.6 F (36.4 C)-98.1 F (36.7 C)] 97.6 F (36.4 C) (03/15 0725) Pulse Rate:  [61-64] 61 (03/15 1143) Resp:  [18-20] 20 (03/15 1143) BP: (109-150)/(54-68) 139/58 (03/15 1143) SpO2:  [95 %-100 %] 95 % (03/15 1143) Weight:  [66.7 kg] 66.7 kg (03/15 0725)  Weight change:  Filed Weights   02/16/19 2052 02/18/19 0725  Weight: 50.7 kg 66.7 kg    Intake/Output:    Intake/Output Summary (Last 24 hours) at 02/18/2019 1336 Last data filed at 02/18/2019 1022 Gross per 24 hour  Intake 360 ml  Output -  Net 360 ml     Physical Exam: General:  No acute distress, laying in the bed  HEENT  moist oral mucous membranes  Neck  supple, no masses  Pulm/lungs  normal breathing effort on room air, clear to auscultation  CVS/Heart  no rub or gallop  Abdomen:   Soft, nontender  Extremities:  No peripheral edema  Neurologic:  Alert, able to follow commands, confused  Skin:  No acute rashes    Basic Metabolic Panel:  Recent Labs  Lab 02/16/19 2102  02/17/19 1836 02/18/19 0013 02/18/19 0556 02/18/19 1043 02/18/19 1249  NA 117*   < > 122* 123* 126* 124* 122*  K 3.7  --   --  4.0  --   --  4.0  CL 86*  --   --  93*  --   --  92*  CO2 20*  --   --  22  --   --  24  GLUCOSE 137*  --   --  115*  --   --  162*  BUN 8  --   --  13  --   --  12  CREATININE 0.46  --   --  0.57  --   --  0.49  CALCIUM 8.5*  --   --  8.5*  --   --  8.5*   < > = values in this interval not displayed.     CBC: Recent Labs  Lab 02/16/19 2102  WBC 9.0  HGB 13.2  HCT 36.5  MCV 88.6   PLT 304     No results found for: HEPBSAG, HEPBSAB, HEPBIGM    Microbiology:  No results found for this or any previous visit (from the past 240 hour(s)).  Coagulation Studies: No results for input(s): LABPROT, INR in the last 72 hours.  Urinalysis: Recent Labs    02/17/19 0041  COLORURINE STRAW*  LABSPEC 1.003*  PHURINE 8.0  GLUCOSEU >=500*  HGBUR NEGATIVE  BILIRUBINUR NEGATIVE  KETONESUR NEGATIVE  PROTEINUR NEGATIVE  NITRITE NEGATIVE  LEUKOCYTESUR NEGATIVE      Imaging: No results found.   Medications:    . aspirin EC  81 mg Oral Daily  . docusate sodium  100 mg Oral BID  . enoxaparin (LOVENOX) injection  40 mg Subcutaneous Q24H  . [START ON 02/19/2019] estradiol  1 Applicatorful Vaginal Once per day on Mon Thu  . feeding supplement (ENSURE ENLIVE)  237 mL Oral BID BM  .  latanoprost  1 drop Both Eyes QHS  . propranolol ER  60 mg Oral Daily  . sodium chloride flush  3 mL Intravenous Q12H  . tolvaptan  15 mg Oral Q24H   acetaminophen **OR** acetaminophen, amLODipine, ondansetron **OR** ondansetron (ZOFRAN) IV  Assessment/ Plan:  85 y.o. Caucasian female with chronic hyponatremia, gout,, recent diagnosis of lung mass  was admitted on 02/16/2019 with generalized weakness and is found to have acute hyponatremia.   1.  Hyponatremia Baseline sodium is 130 on February 09, 2019 Admission sodium 117 which improved to 122 this morning Urine studies show urinalysis specific gravity 1.003, urine osmolality 250, urine sodium 60 Other work-up includes TSH that is mildly elevated.  Patient had a CT scan of the chest about 2 weeks ago which showed possible lung mass but further work-up was recommended.  Given the history of hyponatremia for at least 7 years, unlikely it is related to lung mass.  At this time, patient appears to be euvolemic.  Her blood pressure is elevated.  Plan to institute fluid restriction of about 1000 to 1200 cc/day.  Patient is having a hard time  following fluid restriction.  This morning clinically, she appears more confused.  Urine studies again show osmolality of 285 -Discussed with family.  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 - patient is allergic to Brinzolamide.  We will have to clarify from pharmacy whether torsemide will be able to be used as outpatient or not.    LOS: [place] 3/15/20201:36 PM  [place], [place]  Note: This note was prepared with Dragon dictation. Any transcription errors are unintentional

## 2019-02-19 LAB — SODIUM: Sodium: 129 mmol/L — ABNORMAL LOW (ref 135–145)

## 2019-02-19 LAB — BASIC METABOLIC PANEL
Anion gap: 8 (ref 5–15)
BUN: 13 mg/dL (ref 8–23)
CO2: 24 mmol/L (ref 22–32)
Calcium: 8.8 mg/dL — ABNORMAL LOW (ref 8.9–10.3)
Chloride: 97 mmol/L — ABNORMAL LOW (ref 98–111)
Creatinine, Ser: 0.53 mg/dL (ref 0.44–1.00)
GFR calc Af Amer: >60 mL/min (ref >60)
GFR calc non Af Amer: >60 mL/min (ref >60)
Glucose, Bld: 128 mg/dL — ABNORMAL HIGH (ref 70–99)
POTASSIUM: 4.3 mmol/L (ref 3.5–5.1)
Sodium: 129 mmol/L — ABNORMAL LOW (ref 135–145)

## 2019-02-19 LAB — KAPPA/LAMBDA LIGHT CHAINS
Kappa free light chain: 8.1 mg/L (ref 3.3–19.4)
Kappa, lambda light chain ratio: 0.94 (ref 0.26–1.65)
Lambda free light chains: 8.6 mg/L (ref 5.7–26.3)

## 2019-02-19 LAB — PROTEIN ELECTROPHORESIS, SERUM
A/G Ratio: 1.7 (ref 0.7–1.7)
ALPHA-1-GLOBULIN: 0.1 g/dL (ref 0.0–0.4)
ALPHA-2-GLOBULIN: 0.6 g/dL (ref 0.4–1.0)
Albumin ELP: 3.8 g/dL (ref 2.9–4.4)
BETA GLOBULIN: 0.9 g/dL (ref 0.7–1.3)
Gamma Globulin: 0.5 g/dL (ref 0.4–1.8)
Globulin, Total: 2.2 g/dL (ref 2.2–3.9)
Total Protein ELP: 6 g/dL (ref 6.0–8.5)

## 2019-02-19 NOTE — Progress Notes (Signed)
[place] at [place] NAME: Cathy Newman    MR#:  [id number]  DATE OF BIRTH:  02-23-1925  SUBJECTIVE:   Pt. Here due to weakness/AMS and noted to be Hyponatremic.  Sodium is improving with fluid restriction and tolvaptan and pt. Feels better. Family at bedside.    REVIEW OF SYSTEMS:    Review of Systems  Constitutional: Negative for chills and fever.  HENT: Negative for congestion and tinnitus.   Eyes: Negative for blurred vision and double vision.  Respiratory: Negative for cough, shortness of breath and wheezing.   Cardiovascular: Negative for chest pain, orthopnea and PND.  Gastrointestinal: Negative for abdominal pain, diarrhea, nausea and vomiting.  Genitourinary: Negative for dysuria and hematuria.  Neurological: Negative for dizziness, sensory change and focal weakness.  All other systems reviewed and are negative.   Nutrition: Regular Tolerating Diet: Yes Tolerating PT: pt. Is ambulatory  DRUG ALLERGIES:   Allergies  Allergen Reactions  . Amoxicillin Hives  . Brinzolamide Other (See Comments)  . Other Other (See Comments)    MYCINS MYCINS    VITALS:  Blood pressure (!) 115/50, pulse 66, temperature 98.3 F (36.8 C), temperature source Oral, resp. rate 18, height 5\' 1"  (1.549 m), weight 49 kg, SpO2 97 %.  PHYSICAL EXAMINATION:   Physical Exam  GENERAL:  85 y.o.-year-old patient lying in bed in no acute distress.  EYES: Pupils equal, round, reactive to light and accommodation. No scleral icterus. Extraocular muscles intact.  HEENT: Head atraumatic, normocephalic. Oropharynx and nasopharynx clear.  NECK:  Supple, no jugular venous distention. No thyroid enlargement, no tenderness.  LUNGS: Normal breath sounds bilaterally, no wheezing, rales, rhonchi. No use of accessory muscles of respiration.  CARDIOVASCULAR: S1, S2 normal. No murmurs, rubs, or gallops.  ABDOMEN: Soft, nontender, nondistended. Bowel sounds present.  No organomegaly or mass.  EXTREMITIES: No cyanosis, clubbing or edema b/l.    NEUROLOGIC: Cranial nerves II through XII are intact. No focal Motor or sensory deficits b/l.   PSYCHIATRIC: The patient is alert and oriented x 3.  SKIN: No obvious rash, lesion, or ulcer.    LABORATORY PANEL:   CBC Recent Labs  Lab 02/16/19 2102  WBC 9.0  HGB 13.2  HCT 36.5  PLT 304   ------------------------------------------------------------------------------------------------------------------  Chemistries  Recent Labs  Lab 02/19/19 0354 02/19/19 1503  NA 129* 129*  K 4.3  --   CL 97*  --   CO2 24  --   GLUCOSE 128*  --   BUN 13  --   CREATININE 0.53  --   CALCIUM 8.8*  --    ------------------------------------------------------------------------------------------------------------------  Cardiac Enzymes Recent Labs  Lab 02/16/19 2102  TROPONINI <0.03   ------------------------------------------------------------------------------------------------------------------  RADIOLOGY:  No results found.   ASSESSMENT AND PLAN:   85 yo female w/ hx of hypertension, glaucoma, lung mass who presents to the hospital due to weakness and altered mental status and noted to be hyponatremic  1.  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 - Appreciate nephrology input and patient apparently has increasing intake of fluids and they are recommending fluid restriction for now.  - cont. Fluid restriction did not help, Nephro started on tolvaptan.  - clinically asymptomatic now.  - sodium 129- nephro suggest to wait one more day.  2.  Lung mass- patient was noted on CT scan on admission during her previous hospitalization.  It is  a well-circumscribed mass unlikely this is neoplasm.  Patient was referred as an outpatient to get a PET scan but she currently refuses to get that done and does not want any treatment.   Conservative management for now.  3.  Glaucoma-continue latanoprost eyedrops.  4. Essential HTN - cont. Propranolol, Norvasc.   Possible d/c home tomorrow if sodium continues to improve.    All the records are reviewed and case discussed with Care Management/Social Worker. Management plans discussed with the patient, family and they are in agreement.  CODE STATUS: Full code  DVT Prophylaxis: Lovenox  TOTAL TIME TAKING CARE OF THIS PATIENT: 25 minutes.   POSSIBLE D/C IN 1-2 DAYS, DEPENDING ON CLINICAL CONDITION.   Vaughan Basta M.D on 02/19/2019 at 5:07 PM  Between 7am to 6pm - Pager - [phone]  After 6pm go to [email or web address] - Proofreader  [facility] [place] [facility]  Office  [phone]  CC: Primary care physician; Idelle Crouch, MD

## 2019-02-19 NOTE — Progress Notes (Signed)
[facility], [place] 02/19/19  Subjective:  Patient did receive 1 dose of tolvaptan yesterday. Sodium up to 129. Has a known left-sided lung mass.   Objective:  Vital signs in last 24 hours:  Temp:  [97.7 F (36.5 C)-98.6 F (37 C)] 98.6 F (37 C) (03/16 0420) Pulse Rate:  [68-78] 68 (03/16 0420) Resp:  [18] 18 (03/16 0420) BP: (114-139)/(51-65) 133/60 (03/16 0420) SpO2:  [96 %-97 %] 96 % (03/16 0420) Weight:  [49 kg] 49 kg (03/16 0419)  Weight change:  Filed Weights   02/16/19 2052 02/18/19 0725 02/19/19 0419  Weight: 50.7 kg 66.7 kg 49 kg    Intake/Output:    Intake/Output Summary (Last 24 hours) at 02/19/2019 1228 Last data filed at 02/19/2019 0931 Gross per 24 hour  Intake 1478 ml  Output 1450 ml  Net 28 ml     Physical Exam: General:  No acute distress, laying in the bed  HEENT  moist oral mucous membranes  Neck  supple, no masses  Pulm/lungs  normal breathing effort on room air, clear to auscultation  CVS/Heart  S1S2 no rubs  Abdomen:   Soft, nontender  Extremities:  No peripheral edema  Neurologic:  Alert, able to follow commands  Skin:  No acute rashes    Basic Metabolic Panel:  Recent Labs  Lab 02/16/19 2102  02/18/19 0013 02/18/19 0556 02/18/19 1043 02/18/19 1249 02/18/19 1945 02/19/19 0354  NA 117*   < > 123* 126* 124* 122* 126* 129*  K 3.7  --  4.0  --   --  4.0  --  4.3  CL 86*  --  93*  --   --  92*  --  97*  CO2 20*  --  22  --   --  24  --  24  GLUCOSE 137*  --  115*  --   --  162*  --  128*  BUN 8  --  13  --   --  12  --  13  CREATININE 0.46  --  0.57  --   --  0.49  --  0.53  CALCIUM 8.5*  --  8.5*  --   --  8.5*  --  8.8*   < > = values in this interval not displayed.     CBC: Recent Labs  Lab 02/16/19 2102  WBC 9.0  HGB 13.2  HCT 36.5  MCV 88.6  PLT 304     No results found for: HEPBSAG, HEPBSAB, HEPBIGM    Microbiology:  No results found for this or any previous visit (from the  past 240 hour(s)).  Coagulation Studies: No results for input(s): LABPROT, INR in the last 72 hours.  Urinalysis: Recent Labs    02/17/19 0041  COLORURINE STRAW*  LABSPEC 1.003*  PHURINE 8.0  GLUCOSEU >=500*  HGBUR NEGATIVE  BILIRUBINUR NEGATIVE  KETONESUR NEGATIVE  PROTEINUR NEGATIVE  NITRITE NEGATIVE  LEUKOCYTESUR NEGATIVE      Imaging: No results found.   Medications:    . aspirin EC  81 mg Oral Daily  . docusate sodium  100 mg Oral BID  . enoxaparin (LOVENOX) injection  40 mg Subcutaneous Q24H  . estradiol  1 Applicatorful Vaginal Once per day on Mon Thu  . feeding supplement (ENSURE ENLIVE)  237 mL Oral BID BM  . latanoprost  1 drop Both Eyes QHS  . propranolol ER  60 mg Oral Daily  . sodium chloride flush  3 mL Intravenous  Q12H  . tolvaptan  15 mg Oral Q24H   acetaminophen **OR** acetaminophen, amLODipine, ondansetron **OR** ondansetron (ZOFRAN) IV  Assessment/ Plan:  85 y.o. Caucasian female with chronic hyponatremia, gout, recent diagnosis of lung mass  was admitted on 02/16/2019 with generalized weakness and is found to have acute hyponatremia.   1.  Hyponatremia Baseline sodium is 130 on February 09, 2019 Admission sodium 117 which improved to 129 this morning Urine studies show urinalysis specific gravity 1.003, urine osmolality 250, urine sodium 60 Other work-up includes TSH that is mildly elevated.  Patient had a CT scan of the chest about 2 weeks ago which showed possible lung mass but further work-up was recommended.    Plan: Patient has responded well to tolvaptan.  Serum sodium up to 129.  Patient will receive 1 additional dose today.  She does have a left-sided lung mass but has had hyponatremia for significant period of time.  However recommend continued work-up of the lung mass as an outpatient.  Otherwise advised patient on 1200 mL fluid restriction.  We will need to consider adding salt tablets as well.    LOS: 2 [place] Mclees 3/16/202012:28  PM  [place], [place]  Note: This note was prepared with Dragon dictation. Any transcription errors are unintentional

## 2019-02-19 NOTE — Plan of Care (Signed)
Ambulate around  nurses unit  x 3 today and tol well. Chair  good part of pm tol well.  No distress.

## 2019-02-19 NOTE — Plan of Care (Signed)

## 2019-02-20 LAB — BASIC METABOLIC PANEL
ANION GAP: 9 (ref 5–15)
BUN: 17 mg/dL (ref 8–23)
CO2: 24 mmol/L (ref 22–32)
Calcium: 8.8 mg/dL — ABNORMAL LOW (ref 8.9–10.3)
Chloride: 99 mmol/L (ref 98–111)
Creatinine, Ser: 0.62 mg/dL (ref 0.44–1.00)
GFR calc Af Amer: >60 mL/min (ref >60)
GFR calc non Af Amer: >60 mL/min (ref >60)
GLUCOSE: 139 mg/dL — AB (ref 70–99)
POTASSIUM: 3.9 mmol/L (ref 3.5–5.1)
Sodium: 132 mmol/L — ABNORMAL LOW (ref 135–145)

## 2019-02-20 LAB — PROTEIN ELECTRO, RANDOM URINE
Albumin ELP, Urine: 100 %
Alpha-1-Globulin, U: 0 %
Alpha-2-Globulin, U: 0 %
Beta Globulin, U: 0 %
Gamma Globulin, U: 0 %
Total Protein, Urine: 21.3 mg/dL

## 2019-02-20 MED ORDER — SODIUM CHLORIDE 1 G PO TABS
1.0000 g | ORAL_TABLET | Freq: Two times a day (BID) | ORAL | Status: DC
  Administered 2019-02-20: 1 g via ORAL
  Filled 2019-02-20: qty 1

## 2019-02-20 MED ORDER — SODIUM CHLORIDE 1 G PO TABS: 1.0000 g | ORAL_TABLET | Freq: Two times a day (BID) | ORAL | 0 refills | Status: AC

## 2019-02-20 NOTE — Progress Notes (Signed)
[place], [place] 02/20/19  Subjective:  Patient doing better. Sitting up in chair. Sodium up to 132.  Counseled on fluid restriction.    Objective:  Vital signs in last 24 hours:  Temp:  [97.4 F (36.3 C)-98.3 F (36.8 C)] 97.4 F (36.3 C) (03/17 0834) Pulse Rate:  [66-81] 81 (03/17 0834) Resp:  [15-20] 20 (03/17 0834) BP: (115-154)/(48-60) 120/48 (03/17 0834) SpO2:  [94 %-100 %] 100 % (03/17 0834)  Weight change:  Filed Weights   02/16/19 2052 02/18/19 0725 02/19/19 0419  Weight: 50.7 kg 66.7 kg 49 kg    Intake/Output:    Intake/Output Summary (Last 24 hours) at 02/20/2019 1142 Last data filed at 02/20/2019 0947 Gross per 24 hour  Intake 840 ml  Output -  Net 840 ml     Physical Exam: General:  No acute distress, laying in the bed  HEENT  moist oral mucous membranes  Neck  supple, no masses  Pulm/lungs  normal breathing effort on room air, clear to auscultation  CVS/Heart  S1S2 no rubs  Abdomen:   Soft, nontender  Extremities:  No peripheral edema  Neurologic:  Alert, able to follow commands  Skin:  No acute rashes    Basic Metabolic Panel:  Recent Labs  Lab 02/16/19 2102  02/18/19 0013  02/18/19 1249 02/18/19 1945 02/19/19 0354 02/19/19 1503 02/20/19 0319  NA 117*   < > 123*   < > 122* 126* 129* 129* 132*  K 3.7  --  4.0  --  4.0  --  4.3  --  3.9  CL 86*  --  93*  --  92*  --  97*  --  99  CO2 20*  --  22  --  24  --  24  --  24  GLUCOSE 137*  --  115*  --  162*  --  128*  --  139*  BUN 8  --  13  --  12  --  13  --  17  CREATININE 0.46  --  0.57  --  0.49  --  0.53  --  0.62  CALCIUM 8.5*  --  8.5*  --  8.5*  --  8.8*  --  8.8*   < > = values in this interval not displayed.     CBC: Recent Labs  Lab 02/16/19 2102  WBC 9.0  HGB 13.2  HCT 36.5  MCV 88.6  PLT 304     No results found for: HEPBSAG, HEPBSAB, HEPBIGM    Microbiology:  No results found for this or any previous visit (from the past  240 hour(s)).  Coagulation Studies: No results for input(s): LABPROT, INR in the last 72 hours.  Urinalysis: No results for input(s): COLORURINE, LABSPEC, PHURINE, GLUCOSEU, HGBUR, BILIRUBINUR, KETONESUR, PROTEINUR, UROBILINOGEN, NITRITE, LEUKOCYTESUR in the last 72 hours.  Invalid input(s): APPERANCEUR    Imaging: No results found.   Medications:    . aspirin EC  81 mg Oral Daily  . docusate sodium  100 mg Oral BID  . enoxaparin (LOVENOX) injection  40 mg Subcutaneous Q24H  . estradiol  1 Applicatorful Vaginal Once per day on Mon Thu  . feeding supplement (ENSURE ENLIVE)  237 mL Oral BID BM  . latanoprost  1 drop Both Eyes QHS  . propranolol ER  60 mg Oral Daily  . sodium chloride flush  3 mL Intravenous Q12H  . tolvaptan  15 mg Oral Q24H   acetaminophen **OR**  acetaminophen, amLODipine, ondansetron **OR** ondansetron (ZOFRAN) IV  Assessment/ Plan:  85 y.o. Caucasian female with chronic hyponatremia, gout, recent diagnosis of lung mass  was admitted on 02/16/2019 with generalized weakness and is found to have acute hyponatremia.   1.  Hyponatremia Baseline sodium is 130 on February 09, 2019 Admission sodium 117 which improved to 129 this morning Urine studies show urinalysis specific gravity 1.003, urine osmolality 250, urine sodium 60 Other work-up includes TSH that is mildly elevated.  Patient had a CT scan of the chest about 2 weeks ago which showed possible lung mass but further work-up was recommended.    Plan: Pt has done well with tolvaptan.  Wlil d/c now.  Advised pt on fluid restriction of 1.2L per day.  Son was instructed on this as well.  Will also start on NaCl 1gram po BID.  Will need monitoring of her sodium by Dr. Candiss Norse in the office as well.      LOS: 3 Zade Falkner 3/17/202011:42 AM  [place], [place]  Note: This note was prepared with Dragon dictation. Any transcription errors are unintentional

## 2019-02-20 NOTE — Discharge Summary (Signed)
[facility] NAME: Cathy Newman    MR#:  [id number]  DATE OF BIRTH:  1925-10-28  DATE OF ADMISSION:  02/16/2019 ADMITTING PHYSICIAN: Harrie Foreman, MD  DATE OF DISCHARGE: 02/20/2019   PRIMARY CARE PHYSICIAN: Idelle Crouch, MD    ADMISSION DIAGNOSIS:  Hyponatremia [E87.1] Generalized weakness [R53.1]  DISCHARGE DIAGNOSIS:  Active Problems:   Hyponatremia   SECONDARY DIAGNOSIS:   Past Medical History:  Diagnosis Date  . Cancer ([place])    melanoma on knee  . Glaucoma     HOSPITAL COURSE:   85 yo female w/ hx of hypertension, glaucoma, lung mass who presents to the hospital due to weakness and altered mental status and noted to be hyponatremic  1.  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 - Appreciate nephrology input and patient apparently has increasing intake of fluids and they are recommending fluid restriction for now.  - cont. Fluid restriction did not help, Nephro started on tolvaptan.  - clinically asymptomatic now.  - sodium 129- 132.  Nephrology suggested to take sodium chloride tablets at home and follow in 1 week in clinic.  Advised on fluid restriction after discharge.  2.  Lung mass- patient was noted on CT scan on admission during her previous hospitalization.  It is a well-circumscribed mass unlikely this is neoplasm.  Patient was referred as an outpatient to get a PET scan but she currently refuses to get that done and does not want any treatment.  Conservative management for now.  3.  Glaucoma-continue latanoprost eyedrops.  4. Essential HTN - cont. Propranolol, Norvasc.    DISCHARGE CONDITIONS:   Stable.  CONSULTS OBTAINED:    DRUG ALLERGIES:   Allergies  Allergen Reactions  . Amoxicillin Hives  . Brinzolamide Other (See Comments)  . Other Other (See Comments)    MYCINS MYCINS     DISCHARGE MEDICATIONS:   Allergies as of 02/20/2019      Reactions   Amoxicillin Hives   Brinzolamide Other (See Comments)   Other Other (See Comments)   MYCINS MYCINS      Medication List    TAKE these medications   amLODipine 2.5 MG tablet Commonly known as:  NORVASC Take 1 tablet (2.5 mg total) by mouth daily as needed. If bp is >180   aspirin EC 81 MG tablet Take 81 mg by mouth daily.   ESTRACE VAGINAL 0.1 MG/GM vaginal cream Generic drug:  estradiol USE 1 GRAM VAGINALLY TWO TIMES A WEEK AND APPLY TOPICALLY NIGHTLY TO URETHRAL MEATUS What changed:  See the new instructions.   Lumigan 0.01 % Soln Generic drug:  bimatoprost Place 1 drop into both eyes at bedtime.   propranolol ER 60 MG 24 hr capsule Commonly known as:  INDERAL LA Take 60 mg by mouth daily.   sodium chloride 1 g tablet Take 1 tablet (1 g total) by mouth 2 (two) times daily with a meal.   timolol 0.5 % ophthalmic solution Commonly known as:  TIMOPTIC Place 1 drop into both eyes 2 (two) times daily.        DISCHARGE INSTRUCTIONS:    Follow with nephro clinic in 1 week.  If you experience worsening of your admission symptoms, develop shortness of breath, life threatening emergency, suicidal or homicidal thoughts you must seek medical attention immediately by calling 911 or calling your MD immediately  if symptoms less severe.  You Must read complete instructions/literature along with all the possible adverse reactions/side effects for all the Medicines you take and that have been prescribed to you. Take any new Medicines after you have completely understood and accept all the possible adverse reactions/side effects.   Please note  You were cared for by a hospitalist during your hospital stay. If you have any questions about your discharge medications or the care you received while you were in the hospital after you are discharged, you can call the unit and asked to speak with the hospitalist  on call if the hospitalist that took care of you is not available. Once you are discharged, your primary care physician will handle any further medical issues. Please note that NO REFILLS for any discharge medications will be authorized once you are discharged, as it is imperative that you return to your primary care physician (or establish a relationship with a primary care physician if you do not have one) for your aftercare needs so that they can reassess your need for medications and monitor your lab values.    Today   CHIEF COMPLAINT:   Chief Complaint  Patient presents with  . Weakness  . Anxiety    HISTORY OF PRESENT ILLNESS:  Cathy Newman  is a 85 y.o. female with a known history of hyponatremia as well as glaucoma presents to the emergency department complaining of weakness.  Her daughter also reports some confusion.  She is used to this pattern when the patient sodium is low.  The patient has not had any seizure activity.  Due to her abnormal findings the emergency department staff call hospitalist service for further evaluation.   VITAL SIGNS:  Blood pressure (!) 120/48, pulse 81, temperature (!) 97.4 F (36.3 C), temperature source Oral, resp. rate 20, height 5\' 1"  (1.549 m), weight 49 kg, SpO2 100 %.  I/O:    Intake/Output Summary (Last 24 hours) at 02/20/2019 1407 Last data filed at 02/20/2019 1406 Gross per 24 hour  Intake 840 ml  Output -  Net 840 ml    PHYSICAL EXAMINATION:  GENERAL:  85 y.o.-year-old patient lying in the bed with no acute distress.  EYES: Pupils equal, round, reactive to light and accommodation. No scleral icterus. Extraocular muscles intact.  HEENT: Head atraumatic, normocephalic. Oropharynx and nasopharynx clear.  NECK:  Supple, no jugular venous distention. No thyroid enlargement, no tenderness.  LUNGS: Normal breath sounds bilaterally, no wheezing, rales,rhonchi or crepitation. No use of accessory muscles of respiration.  CARDIOVASCULAR: S1,  S2 normal. No murmurs, rubs, or gallops.  ABDOMEN: Soft, non-tender, non-distended. Bowel sounds present. No organomegaly or mass.  EXTREMITIES: No pedal edema, cyanosis, or clubbing.  NEUROLOGIC: Cranial nerves II through XII are intact. Muscle strength 5/5 in all extremities. Sensation intact. Gait not checked.  PSYCHIATRIC: The patient is alert and oriented x 3.  SKIN: No obvious rash, lesion, or ulcer.   DATA REVIEW:   CBC Recent Labs  Lab 02/16/19 2102  WBC 9.0  HGB 13.2  HCT 36.5  PLT 304    Chemistries  Recent Labs  Lab 02/20/19 0319  NA 132*  K 3.9  CL 99  CO2 24  GLUCOSE 139*  BUN 17  CREATININE 0.62  CALCIUM 8.8*    Cardiac Enzymes Recent Labs  Lab 02/16/19 2102  TROPONINI <0.03    Microbiology Results  No results found for this or any previous visit.  RADIOLOGY:  No results found.  EKG:   Orders placed or performed  during the hospital encounter of 02/16/19  . EKG 12-Lead  . EKG 12-Lead  . ED EKG  . ED EKG  . EKG 12-Lead  . EKG 12-Lead  . EKG      Management plans discussed with the patient, family and they are in agreement.  CODE STATUS:     Code Status Orders  (From admission, onward)         Start     Ordered   02/17/19 0227  Full code  Continuous     02/17/19 0226        Code Status History    Date Active Date Inactive Code Status Order ID Comments User Context   02/08/2019 2052 02/09/2019 1908 Full Code [id number]  Mayo, Pete Pelt, MD Inpatient    Advance Directive Documentation     Most Recent Value  Type of Advance Directive  Living will  Pre-existing out of facility DNR order (yellow form or pink MOST form)  -  "MOST" Form in Place?  -      TOTAL TIME TAKING CARE OF THIS PATIENT: 35 minutes.    Vaughan Basta M.D on 02/20/2019 at 2:07 PM  Between 7am to 6pm - Pager - [phone]  After 6pm go to [email or web address] - password [email or web address] [facility]  Office  [phone]  CC: Primary care  physician; Idelle Crouch, MD   Note: This dictation was prepared with Dragon dictation along with smaller phrase technology. Any transcriptional errors that result from this process are unintentional.

## 2019-02-20 NOTE — Care Management Important Message (Signed)
Important Message  Patient Details  Name: Cathy Newman MRN: [id number] Date of Birth: [birth year]   Medicare Important Message Given:  Yes    Juliann Pulse A Kazi Reppond 02/20/2019, 10:25 AM

## 2019-02-20 NOTE — Plan of Care (Signed)
Sodium 132 this am Problem: Education: Goal: Knowledge of General Education information will improve Description Including pain rating scale, medication(s)/side effects and non-pharmacologic comfort measures Outcome: Progressing   Problem: Health Behavior/Discharge Planning: Goal: Ability to manage health-related needs will improve Outcome: Progressing   Problem: Clinical Measurements: Goal: Ability to maintain clinical measurements within normal limits will improve Outcome: Progressing Goal: Will remain free from infection Outcome: Progressing Goal: Diagnostic test results will improve Outcome: Progressing Goal: Respiratory complications will improve Outcome: Progressing Goal: Cardiovascular complication will be avoided Outcome: Progressing   Problem: Activity: Goal: Risk for activity intolerance will decrease Outcome: Progressing   Problem: Nutrition: Goal: Adequate nutrition will be maintained Outcome: Progressing   Problem: Coping: Goal: Level of anxiety will decrease Outcome: Progressing   Problem: Elimination: Goal: Will not experience complications related to bowel motility Outcome: Progressing Goal: Will not experience complications related to urinary retention Outcome: Progressing   Problem: Pain Managment: Goal: General experience of comfort will improve Outcome: Progressing   Problem: Safety: Goal: Ability to remain free from injury will improve Outcome: Progressing   Problem: Skin Integrity: Goal: Risk for impaired skin integrity will decrease Outcome: Progressing

## 2019-02-20 NOTE — Progress Notes (Signed)
Pt for discharge home a/o. No resp distress. Instructions with pt and pts son. Meds, diet activity and f/u discussed.  Verbalize understanding.  Sl d/cd.  No voiced c/o.

## 2019-03-05 ENCOUNTER — Ambulatory Visit: Payer: Medicare Other | Admitting: Podiatry

## 2019-03-19 ENCOUNTER — Ambulatory Visit (INDEPENDENT_AMBULATORY_CARE_PROVIDER_SITE_OTHER): Payer: Medicare Other | Admitting: Podiatry

## 2019-03-19 ENCOUNTER — Other Ambulatory Visit: Payer: Self-pay

## 2019-03-19 ENCOUNTER — Encounter: Payer: Self-pay | Admitting: Podiatry

## 2019-03-19 ENCOUNTER — Ambulatory Visit: Payer: Medicare Other | Admitting: Podiatry

## 2019-03-19 VITALS — Temp 97.7°F

## 2019-03-19 DIAGNOSIS — B351 Tinea unguium: Secondary | ICD-10-CM

## 2019-03-19 DIAGNOSIS — L608 Other nail disorders: Secondary | ICD-10-CM

## 2019-03-19 DIAGNOSIS — M79676 Pain in unspecified toe(s): Secondary | ICD-10-CM | POA: Diagnosis not present

## 2019-03-19 DIAGNOSIS — M21619 Bunion of unspecified foot: Secondary | ICD-10-CM

## 2019-03-19 DIAGNOSIS — M79609 Pain in unspecified limb: Principal | ICD-10-CM

## 2019-03-19 NOTE — Progress Notes (Signed)
Complaint:  Visit Type: Patient returns to my office for continued preventative foot care services. Complaint: Patient states" my nails have grown long and thick and become painful to walk and wear shoes" The patient presents for preventative foot care services. No changes to ROS  Podiatric Exam: Vascular: dorsalis pedis and posterior tibial pulses are palpable bilateral. Capillary return is immediate. Temperature gradient is WNL. Skin turgor WNL  Sensorium: Normal Semmes Weinstein monofilament test. Normal tactile sensation bilaterally. Nail Exam: Pt has thick disfigured discolored nails with subungual debris noted bilateral entire nail hallux through fifth toenails Ulcer Exam: There is no evidence of ulcer or pre-ulcerative changes or infection. Orthopedic Exam: Muscle tone and strength are WNL. No limitations in general ROM. No crepitus or effusions noted. Severe HAV  B/L. Skin: No Porokeratosis. No infection or ulcers  Diagnosis:  Onychomycosis, , Pain in right toe, pain in left toes  Treatment & Plan Procedures and Treatment: Consent by patient was obtained for treatment procedures. The patient understood the discussion of treatment and procedures well. All questions were answered thoroughly reviewed. Debridement of mycotic and hypertrophic toenails, 1 through 5 bilateral and clearing of subungual debris. No ulceration, no infection noted.  Return Visit-Office Procedure: Patient instructed to return to the office for a follow up visit 10 weeks. for continued evaluation and treatment.    Gardiner Barefoot

## 2019-05-28 ENCOUNTER — Encounter: Payer: Self-pay | Admitting: Podiatry

## 2019-05-28 ENCOUNTER — Other Ambulatory Visit: Payer: Self-pay

## 2019-05-28 ENCOUNTER — Ambulatory Visit (INDEPENDENT_AMBULATORY_CARE_PROVIDER_SITE_OTHER): Payer: Medicare Other | Admitting: Podiatry

## 2019-05-28 DIAGNOSIS — B351 Tinea unguium: Secondary | ICD-10-CM

## 2019-05-28 DIAGNOSIS — M79675 Pain in left toe(s): Secondary | ICD-10-CM | POA: Diagnosis not present

## 2019-05-28 DIAGNOSIS — M79674 Pain in right toe(s): Secondary | ICD-10-CM

## 2019-05-28 NOTE — Progress Notes (Signed)
Complaint:  Visit Type: Patient returns to my office for continued preventative foot care services. Complaint: Patient states" my nails have grown long and thick and become painful to walk and wear shoes" The patient presents for preventative foot care services. No changes to ROS  Podiatric Exam: Vascular: dorsalis pedis and posterior tibial pulses are palpable bilateral. Capillary return is immediate. Temperature gradient is WNL. Skin turgor WNL  Sensorium: Normal Semmes Weinstein monofilament test. Normal tactile sensation bilaterally. Nail Exam: Pt has thick disfigured discolored nails with subungual debris noted bilateral entire nail hallux through fifth toenails Ulcer Exam: There is no evidence of ulcer or pre-ulcerative changes or infection. Orthopedic Exam: Muscle tone and strength are WNL. No limitations in general ROM. No crepitus or effusions noted. Severe HAV  B/L. Skin: No Porokeratosis. No infection or ulcers  Diagnosis:  Onychomycosis, , Pain in right toe, pain in left toes  Treatment & Plan Procedures and Treatment: Consent by patient was obtained for treatment procedures. The patient understood the discussion of treatment and procedures well. All questions were answered thoroughly reviewed. Debridement of mycotic and hypertrophic toenails, 1 through 5 bilateral and clearing of subungual debris. No ulceration, no infection noted.  Return Visit-Office Procedure: Patient instructed to return to the office for a follow up visit 10 weeks. for continued evaluation and treatment.    Gardiner Barefoot

## 2019-08-06 ENCOUNTER — Ambulatory Visit (INDEPENDENT_AMBULATORY_CARE_PROVIDER_SITE_OTHER): Payer: Medicare Other | Admitting: Podiatry

## 2019-08-06 ENCOUNTER — Other Ambulatory Visit: Payer: Self-pay

## 2019-08-06 ENCOUNTER — Encounter: Payer: Self-pay | Admitting: Podiatry

## 2019-08-06 DIAGNOSIS — M79675 Pain in left toe(s): Secondary | ICD-10-CM | POA: Diagnosis not present

## 2019-08-06 DIAGNOSIS — M79674 Pain in right toe(s): Secondary | ICD-10-CM | POA: Diagnosis not present

## 2019-08-06 DIAGNOSIS — L608 Other nail disorders: Secondary | ICD-10-CM | POA: Diagnosis not present

## 2019-08-06 DIAGNOSIS — B351 Tinea unguium: Secondary | ICD-10-CM

## 2019-08-06 NOTE — Progress Notes (Signed)
Complaint:  Visit Type: Patient returns to my office for continued preventative foot care services. Complaint: Patient states" my nails have grown long and thick and become painful to walk and wear shoes" The patient presents for preventative foot care services. No changes to ROS  Podiatric Exam: Vascular: dorsalis pedis and posterior tibial pulses are palpable bilateral. Capillary return is immediate. Temperature gradient is WNL. Skin turgor WNL  Sensorium: Normal Semmes Weinstein monofilament test. Normal tactile sensation bilaterally. Nail Exam: Pt has thick disfigured discolored nails with subungual debris noted bilateral entire nail hallux through fifth toenails Ulcer Exam: There is no evidence of ulcer or pre-ulcerative changes or infection. Orthopedic Exam: Muscle tone and strength are WNL. No limitations in general ROM. No crepitus or effusions noted. Severe HAV  B/L. Skin: No Porokeratosis. No infection or ulcers  Diagnosis:  Onychomycosis, , Pain in right toe, pain in left toes  Treatment & Plan Procedures and Treatment: Consent by patient was obtained for treatment procedures. The patient understood the discussion of treatment and procedures well. All questions were answered thoroughly reviewed. Debridement of mycotic and hypertrophic toenails, 1 through 5 bilateral and clearing of subungual debris. No ulceration, no infection noted.  Return Visit-Office Procedure: Patient instructed to return to the office for a follow up visit 10 weeks. for continued evaluation and treatment.    Holly Pring 

## 2019-09-23 ENCOUNTER — Other Ambulatory Visit: Payer: Self-pay

## 2019-09-23 ENCOUNTER — Emergency Department
Admission: EM | Admit: 2019-09-23 | Discharge: 2019-09-23 | Disposition: A | Discharge status: Home / Self Care | Payer: Medicare Other | Attending: Emergency Medicine | Admitting: Emergency Medicine

## 2019-09-23 ENCOUNTER — Encounter: Payer: Self-pay | Admitting: Emergency Medicine

## 2019-09-23 ENCOUNTER — Emergency Department: Payer: Medicare Other

## 2019-09-23 DIAGNOSIS — Z79899 Other long term (current) drug therapy: Secondary | ICD-10-CM | POA: Diagnosis not present

## 2019-09-23 DIAGNOSIS — Y929 Unspecified place or not applicable: Secondary | ICD-10-CM | POA: Diagnosis not present

## 2019-09-23 DIAGNOSIS — Z7982 Long term (current) use of aspirin: Secondary | ICD-10-CM | POA: Diagnosis not present

## 2019-09-23 DIAGNOSIS — W01198A Fall on same level from slipping, tripping and stumbling with subsequent striking against other object, initial encounter: Secondary | ICD-10-CM | POA: Insufficient documentation

## 2019-09-23 DIAGNOSIS — S0990XA Unspecified injury of head, initial encounter: Secondary | ICD-10-CM | POA: Diagnosis present

## 2019-09-23 DIAGNOSIS — Y998 Other external cause status: Secondary | ICD-10-CM | POA: Diagnosis not present

## 2019-09-23 DIAGNOSIS — Z85828 Personal history of other malignant neoplasm of skin: Secondary | ICD-10-CM | POA: Insufficient documentation

## 2019-09-23 DIAGNOSIS — Z87891 Personal history of nicotine dependence: Secondary | ICD-10-CM | POA: Diagnosis not present

## 2019-09-23 DIAGNOSIS — S0083XA Contusion of other part of head, initial encounter: Secondary | ICD-10-CM | POA: Diagnosis not present

## 2019-09-23 DIAGNOSIS — I10 Essential (primary) hypertension: Secondary | ICD-10-CM | POA: Insufficient documentation

## 2019-09-23 DIAGNOSIS — W19XXXA Unspecified fall, initial encounter: Secondary | ICD-10-CM

## 2019-09-23 DIAGNOSIS — Y9389 Activity, other specified: Secondary | ICD-10-CM | POA: Insufficient documentation

## 2019-09-23 MED ORDER — PROPRANOLOL HCL 20 MG PO TABS: 60.0000 mg | ORAL_TABLET | Freq: Once | ORAL | Status: DC

## 2019-09-23 MED ORDER — PROPRANOLOL HCL ER 60 MG PO CP24
60.0000 mg | ORAL_CAPSULE | Freq: Every day | ORAL | Status: DC
  Filled 2019-09-23: qty 1

## 2019-09-23 NOTE — ED Notes (Signed)
Discussed with MD holding propranolol due to pt's HR <60.  MD at bedside, discussed holding medication with pt and daughter due to HR.  Pt and daughter state understanding.  MD reviewed results with pt and daughter.

## 2019-09-23 NOTE — ED Triage Notes (Signed)
Pt reports Friday her feet got tangled up and she fell hitting her face on the gravel. Pt with significant bruising noted to her lower jaw into neck. Pt states feels swollen. Pt denies LOC. Pt reports take aspirin daily.

## 2019-09-23 NOTE — ED Notes (Signed)
ED Provider at bedside. 

## 2019-09-23 NOTE — Discharge Instructions (Addendum)
Please keep a daily record of your blood pressure is.  Let Dr. Doy Hutching see them.  I would follow-up with Dr. Doy Hutching this coming week.  He can check your sodium again to.  Please return for any further problems.  Remember I gave you your propranolol this morning.

## 2019-09-23 NOTE — ED Notes (Signed)
Patient transported to CT 

## 2019-09-23 NOTE — ED Notes (Signed)
Introduced self to pt, pt presents in NAD, pt reports mechanical fall Fri night.  Pt with noted bruising to her chin.  Denies any pain at this time.  A&O x 4, resp even and unlabored, skin otherwise warm, dry and intact.  Pt's daughter at bedside and voices concern re past hyponatremia, states pt does not display symptoms of hyponatremia at this time.  This RN explained would relay concerns to MD.  MD to bedside to speak with daughter.

## 2019-09-23 NOTE — ED Provider Notes (Addendum)
[facility] Provider Note   ____________________________________________   First MD Initiated Contact with Patient 09/23/19 [id number]     (approximate)  I have reviewed the triage vital signs and the nursing notes.   HISTORY  Chief Complaint Fall and Facial Injury    HPI Cathy Newman is a 85 y.o. female who reports she got her feet tangled up and fell down landing on her face.  She comes in with bruising from the lower lip down into the submandibular area.  She reports she did not pass out and nothing hurts her.  She is able to bite down on 3 stacked tongue depressor and hold them while I pull firmly against them.  She has no numbness or weakness in her arms or legs.      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[place]) 02/10/2015   Acne erythematosa 02/10/2015   Thyroid nodule 02/10/2015   Below normal amount of sodium in the blood 02/10/2015   Abnormal blood sugar 10/28/2014   Abnormal glucose level 10/28/2014   Acute infection of nasal sinus 07/19/2014   Female genuine stress incontinence 05/01/2014   Prolapse of urethra 05/01/2014   Urge incontinence of urine 05/01/2014    Past Surgical History:  Procedure Laterality Date   ABDOMINAL HYSTERECTOMY     BREAST BIOPSY Right    CYSTECTOMY     RECTAL PROLAPSE REPAIR     VAGINAL PROLAPSE REPAIR      Prior to Admission medications   Medication Sig Start Date End Date Taking? Authorizing Provider  amLODipine (NORVASC) 2.5 MG tablet Take 1 tablet  (2.5 mg total) by mouth daily as needed. If bp is >180 02/09/19 02/09/20  Bettey Costa, MD  aspirin EC 81 MG tablet Take 81 mg by mouth daily.     [provider]  bimatoprost (LUMIGAN) 0.01 % SOLN Place 1 drop into both eyes at bedtime.     [provider]  estradiol (ESTRACE) 0.1 MG/GM vaginal cream Place vaginally. 07/11/19   [provider]  propranolol ER (INDERAL LA) 60 MG 24 hr capsule Take 60 mg by mouth daily. 02/06/19   [provider]  sodium chloride 1 g tablet Take 1 tablet (1 g total) by mouth 2 (two) times daily with a meal. 02/20/19   Vaughan Basta, MD  timolol (TIMOPTIC) 0.5 % ophthalmic solution Place 1 drop into both eyes 2 (two) times daily.     [provider]    Allergies Amoxicillin, Brinzolamide, and Other  Family History  Problem Relation Age of Onset   Breast cancer Sister    Bladder Cancer Neg Hx    Kidney cancer Neg Hx     Social History Social History   Tobacco Use   Smoking status: Former Smoker   Smokeless tobacco: Never Used  Substance Use Topics   Alcohol use: Not on file    Comment: occasionally   Drug use: Never    Review of Systems  Constitutional: No fever/chills Eyes: No visual changes.  ENT: No sore throat. Cardiovascular: Denies chest pain. Respiratory: Denies shortness of breath. Gastrointestinal: No abdominal pain.  No nausea, no vomiting.  No diarrhea.  No constipation. Genitourinary: Negative for dysuria. Musculoskeletal: Negative for back pain. Skin: Negative for rash. Neurological: Negative for headaches, focal weakness   ____________________________________________   PHYSICAL EXAM:  VITAL SIGNS: ED Triage Vitals  Enc Vitals Group     BP 09/23/19 0805 (!) 190/61     Pulse Rate 09/23/19 0805 67     Resp 09/23/19 0805 19     Temp 09/23/19 0805 97.6 F (36.4 C)     Temp Source 09/23/19 0805 Oral     SpO2 09/23/19 0805 98 %     Weight 09/23/19 0806 114 lb (51.7 kg)      Height 09/23/19 0806 5\' 1"  (1.549 m)     Head Circumference --      Peak Flow --      Pain Score 09/23/19 0805 0     Pain Loc --      Pain Edu? --      Excl. in [place]? --    Constitutional: Alert and oriented. Well appearing and in no acute distress. Eyes: Conjunctivae are normal.  Head: Atraumatic except for the bruising as described in HPI. Nose: No congestion/rhinnorhea. Mouth/Throat: Mucous membranes are moist.  Oropharynx non-erythematous. Neck: No stridor.  Cardiovascular: Normal rate, regular rhythm. Grossly normal heart sounds.  Good peripheral circulation. Respiratory: Normal respiratory effort.  No retractions. Lungs CTAB. Gastrointestinal: Soft and nontender. No distention. No abdominal bruits. No CVA tenderness. Musculoskeletal: No lower extremity tenderness nor edema.   Neurologic:  Normal speech and language. No gross focal neurologic deficits are appreciated.  Skin:  Skin is warm, dry and intact. No rash noted except for bruising as described in HPI. Psychiatric: Mood and affect are normal. Speech and behavior are normal.  ____________________________________________   LABS (all labs ordered are listed, but only abnormal results are displayed)  Labs Reviewed - No data to display ____________________________________________  EKG   ____________________________________________  RADIOLOGY  ED MD interpretation: CT of the head and neck read by radiology reviewed by me show no acute disease.  Official radiology report(s): Ct Head Wo Contrast  Result Date: 09/23/2019 CLINICAL DATA:  Fall, head injury EXAM: CT HEAD WITHOUT CONTRAST CT CERVICAL SPINE WITHOUT CONTRAST TECHNIQUE: Multidetector CT imaging of the head and cervical spine was performed following the standard protocol without intravenous contrast. Multiplanar CT image reconstructions of the cervical spine were also generated. COMPARISON:  CT head dated 02/01/2019. Partial comparison to CT chest dated  02/08/2019. FINDINGS: CT HEAD FINDINGS Brain: No evidence of acute infarction, hemorrhage, hydrocephalus, extra-axial collection or mass lesion/mass effect. Mild cortical atrophy. Subcortical white matter and periventricular small vessel ischemic changes. Vascular: Intracranial atherosclerosis. Skull: Normal. Negative for fracture or focal lesion. Sinuses/Orbits: The visualized paranasal sinuses are essentially clear. The mastoid air cells are unopacified. Other: None. CT CERVICAL SPINE FINDINGS Alignment: Mild straightening of the normal cervical lordosis. Skull base and vertebrae: No acute fracture. No primary bone lesion or focal pathologic process. Soft tissues and spinal canal: No prevertebral fluid or swelling. No visible canal hematoma. Disc levels: Mild degenerative changes of the mid/lower cervical spine. Spinal canal is patent. Upper chest: Stable triangular opacity in the medial left lung apex (series 2/image 74), favoring scarring. Other: Visualized thyroid is unremarkable. IMPRESSION: No evidence of acute intracranial abnormality. Atrophy with small vessel ischemic changes. No evidence of traumatic injury to the cervical spine.  Mild multilevel degenerative changes. Electronically Signed   By: Julian Hy M.D.   On: 09/23/2019 08:54   Ct Cervical Spine Wo Contrast  Result Date: 09/23/2019 CLINICAL DATA:  Fall, head injury EXAM: CT HEAD WITHOUT CONTRAST CT CERVICAL SPINE WITHOUT CONTRAST TECHNIQUE: Multidetector CT imaging of the head and cervical spine was performed following the standard protocol without intravenous contrast. Multiplanar CT image reconstructions of the cervical spine were also generated. COMPARISON:  CT head dated 02/01/2019. Partial comparison to CT chest dated 02/08/2019. FINDINGS: CT HEAD FINDINGS Brain: No evidence of acute infarction, hemorrhage, hydrocephalus, extra-axial collection or mass lesion/mass effect. Mild cortical atrophy. Subcortical white matter and  periventricular small vessel ischemic changes. Vascular: Intracranial atherosclerosis. Skull: Normal. Negative for fracture or focal lesion. Sinuses/Orbits: The visualized paranasal sinuses are essentially clear. The mastoid air cells are unopacified. Other: None. CT CERVICAL SPINE FINDINGS Alignment: Mild straightening of the normal cervical lordosis. Skull base and vertebrae: No acute fracture. No primary bone lesion or focal pathologic process. Soft tissues and spinal canal: No prevertebral fluid or swelling. No visible canal hematoma. Disc levels: Mild degenerative changes of the mid/lower cervical spine. Spinal canal is patent. Upper chest: Stable triangular opacity in the medial left lung apex (series 2/image 74), favoring scarring. Other: Visualized thyroid is unremarkable. IMPRESSION: No evidence of acute intracranial abnormality. Atrophy with small vessel ischemic changes. No evidence of traumatic injury to the cervical spine. Mild multilevel degenerative changes. Electronically Signed   By: Julian Hy M.D.   On: 09/23/2019 08:54    ____________________________________________   PROCEDURES  Procedure(s) performed (including Critical Care):  Procedures   ____________________________________________   INITIAL IMPRESSION / ASSESSMENT AND PLAN / ED COURSE  Cathy Newman was evaluated in Emergency Department on 09/23/2019 for the symptoms described in the history of present illness. She was evaluated in the context of the global COVID-19 pandemic, which necessitated consideration that the patient might be at risk for infection with the SARS-CoV-2 virus that causes COVID-19. Institutional protocols and algorithms that pertain to the evaluation of patients at risk for COVID-19 are in a state of rapid change based on information released by regulatory bodies including the CDC and federal and state organizations. These policies and algorithms were followed during the patient's care in the  ED.    Patient missed her propranolol this morning.  Blood pressure is very high we will give it to her here.  She has no pain anywhere just of bruising.  Because of the fall however we will CT her head and neck to make sure there is no occult bleeding or fracture does happen sometimes at this age.  These are negative I will discharge her home.  ----------------------------------------- 9:00 AM on 09/23/2019 -----------------------------------------  CTs are negative I will let her go.  At this time her blood pressure is 153/90.  Her heart rate is 57-58.  We have not given her the propranolol.  I have asked her to hold the propranolol unless her pulse rate goes above 60.  I have also told her that if her heart rate is still 5758 and her blood pressures going up to 165 1 7180 go ahead and take the propranolol.  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  Her daughter is also medical and can also understand this very well.  She will return if she gets bradycardic and/or lightheaded or woozy.  She will follow-up with Dr. Doy Hutching this week and until she sees him she will take  her blood pressure and pulse twice a day and write down the results.  I am wondering if maybe we need to change her blood pressure medicines around a little bit if she is getting bradycardic and still has a blood pressure that is a little high.   ____________________________________________   FINAL CLINICAL IMPRESSION(S) / ED DIAGNOSES  Final diagnoses:  Fall, initial encounter     ED Discharge Orders    None       Note:  This document was prepared using Dragon voice recognition software and may include unintentional dictation errors.    Nena Polio, MD 09/23/19 0900    Nena Polio, MD 09/23/19 [phone]

## 2019-10-15 ENCOUNTER — Encounter: Payer: Self-pay | Admitting: Podiatry

## 2019-10-15 ENCOUNTER — Other Ambulatory Visit: Payer: Self-pay

## 2019-10-15 ENCOUNTER — Ambulatory Visit (INDEPENDENT_AMBULATORY_CARE_PROVIDER_SITE_OTHER): Payer: Medicare Other | Admitting: Podiatry

## 2019-10-15 DIAGNOSIS — L608 Other nail disorders: Secondary | ICD-10-CM

## 2019-10-15 DIAGNOSIS — B351 Tinea unguium: Secondary | ICD-10-CM

## 2019-10-15 DIAGNOSIS — M79674 Pain in right toe(s): Secondary | ICD-10-CM

## 2019-10-15 DIAGNOSIS — M79675 Pain in left toe(s): Secondary | ICD-10-CM | POA: Diagnosis not present

## 2019-10-15 NOTE — Progress Notes (Signed)
Complaint:  Visit Type: Patient returns to my office for continued preventative foot care services. Complaint: Patient states" my nails have grown long and thick and become painful to walk and wear shoes" The patient presents for preventative foot care services. No changes to ROS  Podiatric Exam: Vascular: dorsalis pedis and posterior tibial pulses are palpable bilateral. Capillary return is immediate. Temperature gradient is WNL. Skin turgor WNL  Sensorium: Normal Semmes Weinstein monofilament test. Normal tactile sensation bilaterally. Nail Exam: Pt has thick disfigured discolored nails with subungual debris noted bilateral entire nail hallux through fifth toenails Ulcer Exam: There is no evidence of ulcer or pre-ulcerative changes or infection. Orthopedic Exam: Muscle tone and strength are WNL. No limitations in general ROM. No crepitus or effusions noted. Severe HAV  B/L. Skin: No Porokeratosis. No infection or ulcers  Diagnosis:  Onychomycosis, , Pain in right toe, pain in left toes  Treatment & Plan Procedures and Treatment: Consent by patient was obtained for treatment procedures. The patient understood the discussion of treatment and procedures well. All questions were answered thoroughly reviewed. Debridement of mycotic and hypertrophic toenails, 1 through 5 bilateral and clearing of subungual debris. No ulceration, no infection noted.  Return Visit-Office Procedure: Patient instructed to return to the office for a follow up visit 10 weeks. for continued evaluation and treatment.    Kaydyn Chism 

## 2019-12-27 ENCOUNTER — Other Ambulatory Visit: Payer: Self-pay

## 2019-12-27 ENCOUNTER — Ambulatory Visit (INDEPENDENT_AMBULATORY_CARE_PROVIDER_SITE_OTHER): Payer: Medicare Other | Admitting: Podiatry

## 2019-12-27 ENCOUNTER — Encounter: Payer: Self-pay | Admitting: Podiatry

## 2019-12-27 DIAGNOSIS — L608 Other nail disorders: Secondary | ICD-10-CM | POA: Diagnosis not present

## 2019-12-27 DIAGNOSIS — B351 Tinea unguium: Secondary | ICD-10-CM | POA: Diagnosis not present

## 2019-12-27 DIAGNOSIS — M79675 Pain in left toe(s): Secondary | ICD-10-CM | POA: Diagnosis not present

## 2019-12-27 DIAGNOSIS — M79674 Pain in right toe(s): Secondary | ICD-10-CM

## 2019-12-27 DIAGNOSIS — M21619 Bunion of unspecified foot: Secondary | ICD-10-CM

## 2019-12-27 NOTE — Progress Notes (Signed)
Complaint:  Visit Type: Patient returns to my office for continued preventative foot care services. Complaint: Patient states" my nails have grown long and thick and become painful to walk and wear shoes" The patient presents for preventative foot care services. No changes to ROS  Podiatric Exam: Vascular: dorsalis pedis and posterior tibial pulses are palpable bilateral. Capillary return is immediate. Temperature gradient is WNL. Skin turgor WNL  Sensorium: Normal Semmes Weinstein monofilament test. Normal tactile sensation bilaterally. Nail Exam: Pt has thick disfigured discolored nails with subungual debris noted bilateral entire nail hallux through fifth toenails Ulcer Exam: There is no evidence of ulcer or pre-ulcerative changes or infection. Orthopedic Exam: Muscle tone and strength are WNL. No limitations in general ROM. No crepitus or effusions noted. Severe HAV  B/L. Skin: No Porokeratosis. No infection or ulcers  Diagnosis:  Onychomycosis, , Pain in right toe, pain in left toes  Treatment & Plan Procedures and Treatment: Consent by patient was obtained for treatment procedures. The patient understood the discussion of treatment and procedures well. All questions were answered thoroughly reviewed. Debridement of mycotic and hypertrophic toenails, 1 through 5 bilateral and clearing of subungual debris. No ulceration, no infection noted.  Padding dispensed. Return Visit-Office Procedure: Patient instructed to return to the office for a follow up visit 10 weeks. for continued evaluation and treatment.    Gardiner Barefoot

## 2020-03-06 ENCOUNTER — Ambulatory Visit: Payer: Medicare Other | Admitting: Podiatry

## 2020-05-08 ENCOUNTER — Other Ambulatory Visit: Payer: Self-pay

## 2020-05-08 ENCOUNTER — Encounter: Payer: Self-pay | Admitting: Podiatry

## 2020-05-08 ENCOUNTER — Ambulatory Visit (INDEPENDENT_AMBULATORY_CARE_PROVIDER_SITE_OTHER): Payer: Medicare Other | Admitting: Podiatry

## 2020-05-08 DIAGNOSIS — B351 Tinea unguium: Secondary | ICD-10-CM | POA: Diagnosis not present

## 2020-05-08 DIAGNOSIS — M79674 Pain in right toe(s): Secondary | ICD-10-CM

## 2020-05-08 DIAGNOSIS — M79675 Pain in left toe(s): Secondary | ICD-10-CM | POA: Diagnosis not present

## 2020-05-08 DIAGNOSIS — L608 Other nail disorders: Secondary | ICD-10-CM | POA: Diagnosis not present

## 2020-05-08 NOTE — Progress Notes (Signed)
This patient returns to the office for evaluation and treatment of long thick painful nails .  This patient is unable to trim her own nails since the patient cannot reach her feet.  Patient says the nails are painful walking and wearing her shoes. She returns for preventive foot care services. Patient says she developed a painful outside border right big toe which she treated and soaked in epsom salts.  General Appearance  Alert, conversant and in no acute stress.  Vascular  Dorsalis pedis and posterior tibial  pulses are palpable  bilaterally.  Capillary return is within normal limits  bilaterally. Temperature is within normal limits  bilaterally.  Neurologic  Senn-Weinstein monofilament wire test within normal limits  bilaterally. Muscle power within normal limits bilaterally.  Nails Thick disfigured discolored nails with subungual debris  from hallux to fifth toes bilaterally. No evidence of bacterial infection or drainage bilaterally.  Orthopedic  No limitations of motion  feet .  No crepitus or effusions noted.  No bony pathology or digital deformities noted.  Skin  normotropic skin with no porokeratosis noted bilaterally.  No signs of infections or ulcers noted. HAV  B/L.    Onychomycosis  Pain in toes right foot  Pain in toes left foot  Debridement  of nails  1-5  B/L with a nail nipper.  Nails were then filed using a dremel tool with no incidents.    RTC 10 weeks   Gardiner Barefoot DPM

## 2020-07-17 ENCOUNTER — Ambulatory Visit (INDEPENDENT_AMBULATORY_CARE_PROVIDER_SITE_OTHER): Payer: Medicare Other | Admitting: Podiatry

## 2020-07-17 ENCOUNTER — Other Ambulatory Visit: Payer: Self-pay

## 2020-07-17 ENCOUNTER — Encounter: Payer: Self-pay | Admitting: Podiatry

## 2020-07-17 DIAGNOSIS — M21619 Bunion of unspecified foot: Secondary | ICD-10-CM

## 2020-07-17 DIAGNOSIS — B351 Tinea unguium: Secondary | ICD-10-CM | POA: Diagnosis not present

## 2020-07-17 DIAGNOSIS — M79674 Pain in right toe(s): Secondary | ICD-10-CM

## 2020-07-17 DIAGNOSIS — M79675 Pain in left toe(s): Secondary | ICD-10-CM | POA: Diagnosis not present

## 2020-07-17 DIAGNOSIS — L608 Other nail disorders: Secondary | ICD-10-CM | POA: Diagnosis not present

## 2020-07-17 NOTE — Progress Notes (Signed)
This patient returns to the office for evaluation and treatment of long thick painful nails .  This patient is unable to trim her own nails since the patient cannot reach her feet.  Patient says the nails are painful walking and wearing her shoes. She returns for preventive foot care services.   General Appearance  Alert, conversant and in no acute stress.  Vascular  Dorsalis pedis and posterior tibial  pulses are palpable  bilaterally.  Capillary return is within normal limits  bilaterally. Temperature is within normal limits  bilaterally.  Neurologic  Senn-Weinstein monofilament wire test within normal limits  bilaterally. Muscle power within normal limits bilaterally.  Nails Thick disfigured discolored nails with subungual debris  from hallux to fifth toes bilaterally. No evidence of bacterial infection or drainage bilaterally.  Orthopedic  No limitations of motion  feet .  No crepitus or effusions noted.  No bony pathology or digital deformities noted.  Skin  normotropic skin with no porokeratosis noted bilaterally.  No signs of infections or ulcers noted. HAV  B/L.    Onychomycosis  Pain in toes right foot  Pain in toes left foot  Ingrown nail lateral border right great toe.  Debridement  of nails  1-5  B/L with a nail nipper.  Nails were then filed using a dremel tool with no incidents.    RTC 10 weeks   Eligh Rybacki DPM  

## 2020-09-22 ENCOUNTER — Ambulatory Visit: Payer: Medicare Other | Attending: Internal Medicine

## 2020-09-22 DIAGNOSIS — Z23 Encounter for immunization: Secondary | ICD-10-CM

## 2020-09-22 NOTE — Progress Notes (Signed)
   Covid-19 Vaccination Clinic  Name:  Meleny Tregoning    MRN: [id number] DOB: August 14, 1925  09/22/2020  Ms. Benedict was observed post Covid-19 immunization for 15 minutes without incident. She was provided with Vaccine Information Sheet and instruction to access the V-Safe system.   Ms. Freeberg was instructed to call 911 with any severe reactions post vaccine: Marland Kitchen Difficulty breathing  . Swelling of face and throat  . A fast heartbeat  . A bad rash all over body  . Dizziness and weakness

## 2020-09-25 ENCOUNTER — Encounter: Payer: Self-pay | Admitting: Podiatry

## 2020-09-25 ENCOUNTER — Other Ambulatory Visit: Payer: Self-pay

## 2020-09-25 ENCOUNTER — Ambulatory Visit (INDEPENDENT_AMBULATORY_CARE_PROVIDER_SITE_OTHER): Payer: Medicare Other | Admitting: Podiatry

## 2020-09-25 DIAGNOSIS — M79675 Pain in left toe(s): Secondary | ICD-10-CM | POA: Diagnosis not present

## 2020-09-25 DIAGNOSIS — M79674 Pain in right toe(s): Secondary | ICD-10-CM | POA: Diagnosis not present

## 2020-09-25 DIAGNOSIS — B351 Tinea unguium: Secondary | ICD-10-CM | POA: Diagnosis not present

## 2020-09-25 DIAGNOSIS — L608 Other nail disorders: Secondary | ICD-10-CM | POA: Diagnosis not present

## 2020-09-25 NOTE — Progress Notes (Signed)
This patient returns to the office for evaluation and treatment of long thick painful nails .  This patient is unable to trim her own nails since the patient cannot reach her feet.  Patient says the nails are painful walking and wearing her shoes. She returns for preventive foot care services.   General Appearance  Alert, conversant and in no acute stress.  Vascular  Dorsalis pedis and posterior tibial  pulses are palpable  bilaterally.  Capillary return is within normal limits  bilaterally. Temperature is within normal limits  bilaterally.  Neurologic  Senn-Weinstein monofilament wire test within normal limits  bilaterally. Muscle power within normal limits bilaterally.  Nails Thick disfigured discolored nails with subungual debris  from hallux to fifth toes bilaterally. No evidence of bacterial infection or drainage bilaterally.  Orthopedic  No limitations of motion  feet .  No crepitus or effusions noted.  No bony pathology or digital deformities noted.  Skin  normotropic skin with no porokeratosis noted bilaterally.  No signs of infections or ulcers noted. HAV  B/L.    Onychomycosis  Pain in toes right foot  Pain in toes left foot  Ingrown nail lateral border right great toe.  Debridement  of nails  1-5  B/L with a nail nipper.  Nails were then filed using a dremel tool with no incidents.    RTC 10 weeks   Gardiner Barefoot DPM

## 2020-09-30 ENCOUNTER — Other Ambulatory Visit: Payer: Self-pay

## 2020-09-30 ENCOUNTER — Emergency Department
Admission: EM | Admit: 2020-09-30 | Discharge: 2020-09-30 | Disposition: A | Discharge status: Home / Self Care | Payer: Medicare Other | Attending: Emergency Medicine | Admitting: Emergency Medicine

## 2020-09-30 DIAGNOSIS — Z203 Contact with and (suspected) exposure to rabies: Secondary | ICD-10-CM

## 2020-09-30 DIAGNOSIS — I10 Essential (primary) hypertension: Secondary | ICD-10-CM | POA: Insufficient documentation

## 2020-09-30 DIAGNOSIS — Z87891 Personal history of nicotine dependence: Secondary | ICD-10-CM | POA: Insufficient documentation

## 2020-09-30 DIAGNOSIS — W540XXA Bitten by dog, initial encounter: Secondary | ICD-10-CM | POA: Diagnosis not present

## 2020-09-30 DIAGNOSIS — Z79899 Other long term (current) drug therapy: Secondary | ICD-10-CM | POA: Insufficient documentation

## 2020-09-30 DIAGNOSIS — S81851A Open bite, right lower leg, initial encounter: Secondary | ICD-10-CM | POA: Diagnosis not present

## 2020-09-30 DIAGNOSIS — Z2914 Encounter for prophylactic rabies immune globin: Secondary | ICD-10-CM | POA: Diagnosis not present

## 2020-09-30 DIAGNOSIS — S8991XA Unspecified injury of right lower leg, initial encounter: Secondary | ICD-10-CM | POA: Diagnosis present

## 2020-09-30 DIAGNOSIS — Z23 Encounter for immunization: Secondary | ICD-10-CM | POA: Insufficient documentation

## 2020-09-30 DIAGNOSIS — Z85820 Personal history of malignant melanoma of skin: Secondary | ICD-10-CM | POA: Diagnosis not present

## 2020-09-30 MED ORDER — RABIES IMMUNE GLOBULIN 150 UNIT/ML IM INJ
20.0000 [IU]/kg | INJECTION | Freq: Once | INTRAMUSCULAR | Status: AC
  Administered 2020-09-30: 975 [IU] via INTRAMUSCULAR
  Filled 2020-09-30: qty 8

## 2020-09-30 MED ORDER — RABIES VACCINE, PCEC IM SUSR
1.0000 mL | Freq: Once | INTRAMUSCULAR | Status: AC
  Administered 2020-09-30: 1 mL via INTRAMUSCULAR
  Filled 2020-09-30: qty 1

## 2020-09-30 NOTE — ED Triage Notes (Signed)
Pt here via pov, ambulatory to triage,  C/o dog bite to rt calf by unknown dog last Thursday or Friday. reports sent here receive the rabies vaccination. NAD noted at this time.

## 2020-09-30 NOTE — Discharge Instructions (Signed)
Rabies injections need to be given as follows:  October 29 November 2 November 9  Keep the wound clean and dry. Apply antibiotic ointment and a new bandage daily.

## 2020-09-30 NOTE — ED Provider Notes (Signed)
[facility] Emergency Department Provider Note  ____________________________________________  Time seen: Approximately 12:40 PM  I have reviewed the triage vital signs and the nursing notes.   HISTORY  Chief Complaint rabies vaccine   HPI Cathy Newman is a 86 y.o. female who presents to the emergency department for treatment and evaluation of dog bite wound to the right calf. She was walking in her neighborhood last night and was approached by 3 dogs. One bit her jeans and caught the back of her leg. She has a skin tear to the right calf. She was evaluated by urgent care and sent here for rabies injections. The dogs were unfamiliar to her and doesn't believe she will see them again. The dogs were not aggressive acting toward her until she felt the tug/bite.   Past Medical History:  Diagnosis Date   Cancer ([place])    melanoma on knee   Glaucoma     Patient Active Problem List   Diagnosis Date Noted   Pain due to onychomycosis of toenails of both feet 05/28/2019   Hyponatremia 02/17/2019   Acute hyponatremia 02/08/2019   HTN, goal below 140/80 02/06/2019   Atrophic vaginitis 02/10/2015   Glaucoma 02/10/2015   HLD (hyperlipidemia) 02/10/2015   Hypoglycemia 02/10/2015   Malignant melanoma of knee ([place]) 02/10/2015   Acne erythematosa 02/10/2015   Thyroid nodule 02/10/2015   Below normal amount of sodium in the blood 02/10/2015   Abnormal blood sugar 10/28/2014   Abnormal glucose level 10/28/2014   Acute infection of nasal sinus 07/19/2014   Female genuine stress incontinence 05/01/2014   Prolapse of urethra 05/01/2014   Urge incontinence of urine 05/01/2014    Past Surgical History:  Procedure Laterality Date   ABDOMINAL HYSTERECTOMY     BREAST BIOPSY Right    CYSTECTOMY     RECTAL PROLAPSE REPAIR     VAGINAL PROLAPSE REPAIR      Prior to Admission medications   Medication Sig Start Date End Date Taking? Authorizing  Provider  amLODipine (NORVASC) 2.5 MG tablet Take by mouth. 09/19/20 09/19/21  [provider]  aspirin EC 81 MG tablet Take 81 mg by mouth daily.     [provider]  bimatoprost (LUMIGAN) 0.01 % SOLN Place 1 drop into both eyes at bedtime.     [provider]  estradiol (ESTRACE) 0.1 MG/GM vaginal cream Place vaginally. 09/19/20 09/19/21  [provider]  propranolol ER (INDERAL LA) 60 MG 24 hr capsule Take 60 mg by mouth daily. 02/06/19   [provider]  RHOPRESSA 0.02 % SOLN Place 1 drop into the left eye at bedtime. 06/17/20   [provider]  SENNOSIDES PO Take by mouth.    [provider]  sodium chloride 1 g tablet Take 1 tablet (1 g total) by mouth 2 (two) times daily with a meal. 02/20/19   Vaughan Basta, MD  timolol (TIMOPTIC) 0.5 % ophthalmic solution Place 1 drop into both eyes 2 (two) times daily.     [provider]    Allergies Amoxicillin, Brinzolamide, and Other  Family History  Problem Relation Age of Onset   Breast cancer Sister    Bladder Cancer Neg Hx    Kidney cancer Neg Hx     Social History Social History   Tobacco Use   Smoking status: Former Smoker   Smokeless tobacco: Never Used  Substance Use Topics   Alcohol use: Not on file    Comment: occasionally  Drug use: Never    Review of Systems  Constitutional: Negative for fever. Respiratory: Negative for cough or shortness of breath.  Musculoskeletal: Negative for myalgias Skin: Positive for skin tear laceration to right calf. Neurological: Negative for numbness or paresthesias. ____________________________________________   PHYSICAL EXAM:  VITAL SIGNS: ED Triage Vitals  Enc Vitals Group     BP 09/30/20 1209 (!) 181/76     Pulse Rate 09/30/20 1209 69     Resp 09/30/20 1209 16     Temp 09/30/20 1209 98.4 F (36.9 C)     Temp Source 09/30/20 1209 Oral     SpO2 09/30/20 1209 96 %     Weight 09/30/20 1217  108 lb (49 kg)     Height 09/30/20 1217 5\' 1"  (1.549 m)     Head Circumference --      Peak Flow --      Pain Score 09/30/20 1217 0     Pain Loc --      Pain Edu? --      Excl. in [place]? --      Constitutional: Well appearing. Eyes: Conjunctivae are clear without discharge or drainage. Nose: No rhinorrhea noted. Mouth/Throat: Airway is patent.  Neck: No stridor. Unrestricted range of motion observed. Cardiovascular: Capillary refill is <3 seconds.  Respiratory: Respirations are even and unlabored.. Musculoskeletal: Unrestricted range of motion observed. Neurologic: Awake, alert, and oriented x 4.  Skin:  (2) skin tears measuring roughly 1cm on right calf. Widespread ecchymosis over the remainder of the calf. No active bleeding.  ____________________________________________   LABS (all labs ordered are listed, but only abnormal results are displayed)  Labs Reviewed - No data to display ____________________________________________  EKG  Not indicated. ____________________________________________  RADIOLOGY  Not indicated. ____________________________________________   PROCEDURES  Procedures ____________________________________________   INITIAL IMPRESSION / ASSESSMENT AND PLAN / ED COURSE  Cathy Newman is a 86 y.o. female who presents emergency department for rabies series.  See HPI for further details.  Tdap was updated while she was at the walk-in clinic and she was given a prescription for Augmentin.  RabAvert and immunoglobulin have been ordered.  She will be provided with the injection schedule upon discharge.  She is to take Augmentin as prescribed.  Wound care was discussed.  She is to return for the remainder of the rabies series or sooner for symptoms of concern.   Medications  rabies immune globulin (HYPERAB/KEDRAB) injection 975 Units (has no administration in time range)  rabies vaccine (RABAVERT) injection 1 mL (1 mL Intramuscular Given 09/30/20  1249)     Pertinent labs & imaging results that were available during my care of the patient were reviewed by me and considered in my medical decision making (see chart for details).  ____________________________________________   FINAL CLINICAL IMPRESSION(S) / ED DIAGNOSES  Final diagnoses:  Need for post exposure prophylaxis for rabies    ED Discharge Orders    None       Note:  This document was prepared using Dragon voice recognition software and may include unintentional dictation errors.   Victorino Dike, FNP 09/30/20 1311    Lucrezia Starch, MD 09/30/20 1540

## 2020-10-03 ENCOUNTER — Other Ambulatory Visit: Payer: Self-pay

## 2020-10-03 ENCOUNTER — Encounter: Payer: Self-pay | Admitting: Emergency Medicine

## 2020-10-03 ENCOUNTER — Emergency Department
Admission: EM | Admit: 2020-10-03 | Discharge: 2020-10-03 | Disposition: A | Discharge status: Home / Self Care | Payer: Medicare Other | Attending: Emergency Medicine | Admitting: Emergency Medicine

## 2020-10-03 DIAGNOSIS — Z87891 Personal history of nicotine dependence: Secondary | ICD-10-CM | POA: Insufficient documentation

## 2020-10-03 DIAGNOSIS — Z23 Encounter for immunization: Secondary | ICD-10-CM | POA: Diagnosis not present

## 2020-10-03 DIAGNOSIS — Z79899 Other long term (current) drug therapy: Secondary | ICD-10-CM | POA: Insufficient documentation

## 2020-10-03 DIAGNOSIS — Z85820 Personal history of malignant melanoma of skin: Secondary | ICD-10-CM | POA: Diagnosis not present

## 2020-10-03 DIAGNOSIS — Z203 Contact with and (suspected) exposure to rabies: Secondary | ICD-10-CM

## 2020-10-03 DIAGNOSIS — I10 Essential (primary) hypertension: Secondary | ICD-10-CM | POA: Diagnosis not present

## 2020-10-03 DIAGNOSIS — Z7982 Long term (current) use of aspirin: Secondary | ICD-10-CM | POA: Diagnosis not present

## 2020-10-03 MED ORDER — RABIES VACCINE, PCEC IM SUSR
1.0000 mL | Freq: Once | INTRAMUSCULAR | Status: AC
  Administered 2020-10-03: 1 mL via INTRAMUSCULAR
  Filled 2020-10-03: qty 1

## 2020-10-03 NOTE — Discharge Instructions (Addendum)
Return in 4 days for the next rabies shot.

## 2020-10-03 NOTE — ED Provider Notes (Signed)
[facility] Emergency Department Provider Note   ____________________________________________   First MD Initiated Contact with Patient 10/03/20 [phone]     (approximate)  I have reviewed the triage vital signs and the nursing notes.   HISTORY  Chief Complaint Rabies Injection    HPI Cathy Newman is a 86 y.o. female patient presents for second rabies shot secondary to unprovoked dog bite to the right calf on 09/30/2020.  Patient was initially staying in internal medicine and prescribed Augmentin.  Patient also given tetanus booster.  She did not came to the emergency room to start rabies series.  Patient states no adverse reaction to the previous injections.         Past Medical History:  Diagnosis Date  . Cancer ([place])    melanoma on knee  . Glaucoma     Patient Active Problem List   Diagnosis Date Noted  . Pain due to onychomycosis of toenails of both feet 05/28/2019  . Hyponatremia 02/17/2019  . Acute hyponatremia 02/08/2019  . HTN, goal below 140/80 02/06/2019  . Atrophic vaginitis 02/10/2015  . Glaucoma 02/10/2015  . HLD (hyperlipidemia) 02/10/2015  . Hypoglycemia 02/10/2015  . Malignant melanoma of knee ([place]) 02/10/2015  . Acne erythematosa 02/10/2015  . Thyroid nodule 02/10/2015  . Below normal amount of sodium in the blood 02/10/2015  . Abnormal blood sugar 10/28/2014  . Abnormal glucose level 10/28/2014  . Acute infection of nasal sinus 07/19/2014  . Female genuine stress incontinence 05/01/2014  . Prolapse of urethra 05/01/2014  . Urge incontinence of urine 05/01/2014    Past Surgical History:  Procedure Laterality Date  . ABDOMINAL HYSTERECTOMY    . BREAST BIOPSY Right   . CYSTECTOMY    . RECTAL PROLAPSE REPAIR    . VAGINAL PROLAPSE REPAIR      Prior to Admission medications   Medication Sig Start Date End Date Taking? Authorizing Provider  amLODipine (NORVASC) 2.5 MG tablet Take by mouth. 09/19/20 09/19/21   [provider]  aspirin EC 81 MG tablet Take 81 mg by mouth daily.     [provider]  bimatoprost (LUMIGAN) 0.01 % SOLN Place 1 drop into both eyes at bedtime.     [provider]  estradiol (ESTRACE) 0.1 MG/GM vaginal cream Place vaginally. 09/19/20 09/19/21  [provider]  propranolol ER (INDERAL LA) 60 MG 24 hr capsule Take 60 mg by mouth daily. 02/06/19   [provider]  RHOPRESSA 0.02 % SOLN Place 1 drop into the left eye at bedtime. 06/17/20   [provider]  SENNOSIDES PO Take by mouth.    [provider]  sodium chloride 1 g tablet Take 1 tablet (1 g total) by mouth 2 (two) times daily with a meal. 02/20/19   Vaughan Basta, MD  timolol (TIMOPTIC) 0.5 % ophthalmic solution Place 1 drop into both eyes 2 (two) times daily.     [provider]    Allergies Amoxicillin, Brinzolamide, and Other  Family History  Problem Relation Age of Onset  . Breast cancer Sister   . Bladder Cancer Neg Hx   . Kidney cancer Neg Hx     Social History Social History   Tobacco Use  . Smoking status: Former Research scientist (life sciences)  . Smokeless tobacco: Never Used  Substance Use Topics  . Alcohol use: Not on file    Comment: occasionally  . Drug use: Never    Review of Systems Constitutional: No fever/chills Eyes: No visual changes.  ENT: No sore throat. Cardiovascular: Denies chest pain. Respiratory: Denies shortness of breath. Gastrointestinal: No abdominal pain.  No nausea, no vomiting.  No diarrhea.  No constipation. Genitourinary: Negative for dysuria. Musculoskeletal: Negative for back pain. Skin: Negative for rash. Neurological: Negative for headaches, focal weakness or numbness. Psychiatric:  Endocrine:  Hyperlipidemia, hypertension, and hypothyroidism. Hematological/Lymphatic:  Allergic/Immunilogical: Amoxicillin and Brinzolamide ____________________________________________   PHYSICAL EXAM:  VITAL SIGNS: ED  Triage Vitals  Enc Vitals Group     BP 10/03/20 0722 (!) 193/71     Pulse Rate 10/03/20 0722 76     Resp 10/03/20 0722 16     Temp 10/03/20 0722 98.2 F (36.8 C)     Temp Source 10/03/20 0722 Oral     SpO2 10/03/20 0722 97 %     Weight --      Height --      Head Circumference --      Peak Flow --      Pain Score 10/03/20 0714 0     Pain Loc --      Pain Edu? --      Excl. in [place]? --     Constitutional: Alert and oriented. Well appearing and in no acute distress. Cardiovascular: Normal rate, regular rhythm. Grossly normal heart sounds.  Good peripheral circulation. Respiratory: Normal respiratory effort.  No retractions. Lungs CTAB. Skin:  Skin is warm, dry and intact. No rash noted.  Healing skin tear to the right calf. Psychiatric: Mood and affect are normal. Speech and behavior are normal.  ____________________________________________   LABS (all labs ordered are listed, but only abnormal results are displayed)  Labs Reviewed - No data to display ____________________________________________  EKG   ____________________________________________  RADIOLOGY I, Sable Feil, personally viewed and evaluated these images (plain radiographs) as part of my medical decision making, as well as reviewing the written report by the radiologist.  ED MD interpretation:    Official radiology report(s): No results found.  ____________________________________________   PROCEDURES  Procedure(s) performed (including Critical Care):  Procedures   ____________________________________________   INITIAL IMPRESSION / ASSESSMENT AND PLAN / ED COURSE  As part of my medical decision making, I reviewed the following data within the [place]     Patient is here for second series of rabies injections.  Patient voices no concerns from previous injections.  Patient given discharge care instruction advised to continue to follow and scheduled to complete series.            ____________________________________________   FINAL CLINICAL IMPRESSION(S) / ED DIAGNOSES  Final diagnoses:  Contact with and (suspected) exposure to rabies     ED Discharge Orders    None      *Please note:  Cathy Newman was evaluated in Emergency Department on 10/03/2020 for the symptoms described in the history of present illness. She was evaluated in the context of the global COVID-19 pandemic, which necessitated consideration that the patient might be at risk for infection with the SARS-CoV-2 virus that causes COVID-19. Institutional protocols and algorithms that pertain to the evaluation of patients at risk for COVID-19 are in a state of rapid change based on information released by regulatory bodies including the CDC and federal and state organizations. These policies and algorithms were followed during the patient's care in the ED.  Some ED evaluations and interventions may be delayed as a result of limited staffing during and the pandemic.*   Note:  This document was prepared using Dragon  voice recognition software and may include unintentional dictation errors.    Sable Feil, PA-C 10/03/20 0730    Blake Divine, MD 10/04/20 1540

## 2020-10-03 NOTE — ED Triage Notes (Signed)
Pt to ED via POV for 2nd rabies injection. Pt is in NAD and has no complaints at this time.

## 2020-10-07 ENCOUNTER — Ambulatory Visit: Payer: Self-pay

## 2020-10-07 ENCOUNTER — Other Ambulatory Visit: Payer: Self-pay

## 2020-10-07 ENCOUNTER — Ambulatory Visit
Admission: EM | Admit: 2020-10-07 | Discharge: 2020-10-07 | Disposition: A | Discharge status: Home / Self Care | Payer: Medicare Other | Attending: Internal Medicine | Admitting: Internal Medicine

## 2020-10-07 DIAGNOSIS — Z203 Contact with and (suspected) exposure to rabies: Secondary | ICD-10-CM

## 2020-10-07 DIAGNOSIS — Z2914 Encounter for prophylactic rabies immune globin: Secondary | ICD-10-CM

## 2020-10-07 DIAGNOSIS — Z23 Encounter for immunization: Secondary | ICD-10-CM

## 2020-10-07 MED ORDER — RABIES VACCINE, PCEC IM SUSR
1.0000 mL | Freq: Once | INTRAMUSCULAR | Status: AC
  Administered 2020-10-07: 1 mL via INTRAMUSCULAR

## 2020-10-07 NOTE — ED Triage Notes (Signed)
Patient presents to office for 3rd rabies vaccination.   Patient has no complaints at this time.

## 2020-10-14 ENCOUNTER — Other Ambulatory Visit: Payer: Self-pay

## 2020-10-14 ENCOUNTER — Ambulatory Visit
Admission: EM | Admit: 2020-10-14 | Discharge: 2020-10-14 | Disposition: A | Discharge status: Home / Self Care | Payer: Medicare Other | Attending: Family Medicine | Admitting: Family Medicine

## 2020-10-14 ENCOUNTER — Ambulatory Visit (INDEPENDENT_AMBULATORY_CARE_PROVIDER_SITE_OTHER): Payer: Medicare Other

## 2020-10-14 ENCOUNTER — Encounter: Payer: Self-pay | Admitting: Emergency Medicine

## 2020-10-14 ENCOUNTER — Ambulatory Visit: Admit: 2020-10-14 | Disposition: A | Discharge status: Home / Self Care | Payer: Self-pay

## 2020-10-14 DIAGNOSIS — M79662 Pain in left lower leg: Secondary | ICD-10-CM

## 2020-10-14 DIAGNOSIS — L03115 Cellulitis of right lower limb: Secondary | ICD-10-CM

## 2020-10-14 DIAGNOSIS — W540XXS Bitten by dog, sequela: Secondary | ICD-10-CM | POA: Diagnosis not present

## 2020-10-14 DIAGNOSIS — M79605 Pain in left leg: Secondary | ICD-10-CM

## 2020-10-14 DIAGNOSIS — S8991XA Unspecified injury of right lower leg, initial encounter: Secondary | ICD-10-CM

## 2020-10-14 DIAGNOSIS — W2203XA Walked into furniture, initial encounter: Secondary | ICD-10-CM | POA: Diagnosis not present

## 2020-10-14 DIAGNOSIS — S81851S Open bite, right lower leg, sequela: Secondary | ICD-10-CM | POA: Diagnosis not present

## 2020-10-14 DIAGNOSIS — M79661 Pain in right lower leg: Secondary | ICD-10-CM

## 2020-10-14 MED ORDER — RABIES VACCINE, PCEC IM SUSR
1.0000 mL | Freq: Once | INTRAMUSCULAR | Status: AC
  Administered 2020-10-14: 1 mL via INTRAMUSCULAR

## 2020-10-14 MED ORDER — METRONIDAZOLE 500 MG PO TABS: 500.0000 mg | ORAL_TABLET | Freq: Three times a day (TID) | ORAL | 0 refills | Status: AC

## 2020-10-14 MED ORDER — DOXYCYCLINE HYCLATE 100 MG PO CAPS: 100.0000 mg | ORAL_CAPSULE | Freq: Two times a day (BID) | ORAL | 0 refills | Status: AC

## 2020-10-14 NOTE — ED Triage Notes (Signed)
Patient c/o LFT lower leg swelling. Patient c/o RT leg dog bite.   Patient stated LFT lower leg swelling x 5 days. Patient hit leg on table at home and began having swelling and bruising.  Patient has tried ice at home w/ no relief of symptoms.   Patient is concerned about RT leg wound where dog bite occurred. Patient denies pain or abnormal drainage.   Patient is here for the last rabies vaccination (4th shot).   Patient's daughter wants

## 2020-10-14 NOTE — Discharge Instructions (Addendum)
I have sent in doxycycline for you to take twice a day for 7 days  I have sent in metronidazole for you to take three times per day for 7 days  Xray negative for any fractures or misalignments  Follow up with this office or with primary care if symptoms are persisting.  Follow up in the ER for high fever, trouble swallowing, trouble breathing, other concerning symptoms.

## 2020-10-15 NOTE — ED Provider Notes (Signed)
[place]    CSN: [id number] Arrival date & time: 10/14/20  1036      History   Chief Complaint Chief Complaint  Patient presents with  . Leg Swelling  . Rabies Injection    HPI Cathy Newman is a 86 y.o. female.   Reports that she was bitten by a dog to the R calf. Is here today to receive 4th rabies vaccine. Was given Augmentin by her PCP and had an allergic reaction. She did not take an alternate course of antibiotics. States that the area is red, warm, tender, with yellow/green drainage from the area. Denies pain to the area. Denies streaking up the leg.   Also reports that she thinks that she hit her L shin on furniture and has a large knot at the point of impact, and that she has bruising and swelling from the L knee to her L toes. Denies pain with this as well. Has used ice to the area and has elevated the area with little symptom resolution. Denies headache, fever, nausea, vomiting, diarrhea, rash, other symptoms.  ROS per HPI  The history is provided by the patient and a relative.    Past Medical History:  Diagnosis Date  . Cancer ([place])    melanoma on knee  . Glaucoma     Patient Active Problem List   Diagnosis Date Noted  . Pain due to onychomycosis of toenails of both feet 05/28/2019  . Hyponatremia 02/17/2019  . Acute hyponatremia 02/08/2019  . HTN, goal below 140/80 02/06/2019  . Atrophic vaginitis 02/10/2015  . Glaucoma 02/10/2015  . HLD (hyperlipidemia) 02/10/2015  . Hypoglycemia 02/10/2015  . Malignant melanoma of knee ([place]) 02/10/2015  . Acne erythematosa 02/10/2015  . Thyroid nodule 02/10/2015  . Below normal amount of sodium in the blood 02/10/2015  . Abnormal blood sugar 10/28/2014  . Abnormal glucose level 10/28/2014  . Acute infection of nasal sinus 07/19/2014  . Female genuine stress incontinence 05/01/2014  . Prolapse of urethra 05/01/2014  . Urge incontinence of urine 05/01/2014    Past Surgical History:  Procedure  Laterality Date  . ABDOMINAL HYSTERECTOMY    . BREAST BIOPSY Right   . CYSTECTOMY    . RECTAL PROLAPSE REPAIR    . VAGINAL PROLAPSE REPAIR      OB History   No obstetric history on file.      Home Medications    Prior to Admission medications   Medication Sig Start Date End Date Taking? Authorizing Provider  amLODipine (NORVASC) 2.5 MG tablet Take by mouth. 09/19/20 09/19/21 Yes [provider]  aspirin EC 81 MG tablet Take 81 mg by mouth daily.    Yes [provider]  bimatoprost (LUMIGAN) 0.01 % SOLN Place 1 drop into both eyes at bedtime.    Yes [provider]  doxycycline (VIBRAMYCIN) 100 MG capsule Take 1 capsule (100 mg total) by mouth 2 (two) times daily. 10/14/20   Faustino Congress, NP  estradiol (ESTRACE) 0.1 MG/GM vaginal cream Place vaginally. 09/19/20 09/19/21  [provider]  metroNIDAZOLE (FLAGYL) 500 MG tablet Take 1 tablet (500 mg total) by mouth 3 (three) times daily for 7 days. 10/14/20 10/21/20  Faustino Congress, NP  propranolol ER (INDERAL LA) 60 MG 24 hr capsule Take 60 mg by mouth daily. 02/06/19   [provider]  RHOPRESSA 0.02 % SOLN Place 1 drop into the left eye at bedtime. 06/17/20   [provider]  SENNOSIDES PO Take by mouth.  [provider]  sodium chloride 1 g tablet Take 1 tablet (1 g total) by mouth 2 (two) times daily with a meal. 02/20/19   Vaughan Basta, MD  timolol (TIMOPTIC) 0.5 % ophthalmic solution Place 1 drop into both eyes 2 (two) times daily.     [provider]    Family History Family History  Problem Relation Age of Onset  . Breast cancer Sister   . Bladder Cancer Neg Hx   . Kidney cancer Neg Hx     Social History Social History   Tobacco Use  . Smoking status: Former Research scientist (life sciences)  . Smokeless tobacco: Never Used  Substance Use Topics  . Alcohol use: Not on file    Comment: occasionally  . Drug use: Never     Allergies   Amoxicillin,  Brinzolamide, and Other   Review of Systems Review of Systems   Physical Exam Triage Vital Signs ED Triage Vitals  Enc Vitals Group     BP 10/14/20 1133 (!) 149/76     Pulse Rate 10/14/20 1133 66     Resp 10/14/20 1133 15     Temp 10/14/20 1133 98.4 F (36.9 C)     Temp Source 10/14/20 1133 Oral     SpO2 10/14/20 1133 94 %     Weight --      Height --      Head Circumference --      Peak Flow --      Pain Score 10/14/20 1131 6     Pain Loc --      Pain Edu? --      Excl. in [place]? --    No data found.  Updated Vital Signs BP (!) 149/76 (BP Location: Right Arm)   Pulse 66   Temp 98.4 F (36.9 C) (Oral)   Resp 15   SpO2 94%   Visual Acuity Right Eye Distance:   Left Eye Distance:   Bilateral Distance:    Right Eye Near:   Left Eye Near:    Bilateral Near:     Physical Exam Vitals and nursing note reviewed.  Constitutional:      General: She is not in acute distress.    Appearance: Normal appearance. She is well-developed. She is not ill-appearing.  HENT:     Head: Normocephalic and atraumatic.     Mouth/Throat:     Mouth: Mucous membranes are moist.     Pharynx: Oropharynx is clear.  Eyes:     Extraocular Movements: Extraocular movements intact.     Conjunctiva/sclera: Conjunctivae normal.     Pupils: Pupils are equal, round, and reactive to light.  Cardiovascular:     Rate and Rhythm: Normal rate and regular rhythm.     Heart sounds: No murmur heard.   Pulmonary:     Effort: Pulmonary effort is normal. No respiratory distress.     Breath sounds: Normal breath sounds. No stridor. No wheezing, rhonchi or rales.  Chest:     Chest wall: No tenderness.  Abdominal:     General: Bowel sounds are normal.     Palpations: Abdomen is soft.     Tenderness: There is no abdominal tenderness.  Musculoskeletal:        General: Swelling, deformity and signs of injury present.     Cervical back: Normal range of motion and neck supple.       Legs:     Comments:  L leg area of lump to tibia, bruising extends from L  knee to L toes  R leg area of open wounds that are erythematous, warm to touch, draining yellow and green fluid, no streaking noted  Skin:    General: Skin is warm and dry.     Capillary Refill: Capillary refill takes less than 2 seconds.  Neurological:     General: No focal deficit present.     Mental Status: She is alert and oriented to person, place, and time.  Psychiatric:        Mood and Affect: Mood normal.        Behavior: Behavior normal.        Thought Content: Thought content normal.      UC Treatments / Results  Labs (all labs ordered are listed, but only abnormal results are displayed) Labs Reviewed - No data to display  EKG   Radiology DG Tibia/Fibula Left  Result Date: 10/14/2020 CLINICAL DATA:  LEFT lower extremity pain and swelling, struck leg on table at home and began having swelling and bruising, no relief with ice EXAM: LEFT TIBIA AND FIBULA - 2 VIEW COMPARISON:  None at FINDINGS: Osseous demineralization. Knee and ankle joint alignments normal. Pretibial soft tissue swelling at proximal lower leg with additional scattered edema at the lateral mid and distal lower leg to ankle. No acute fracture, dislocation, or bone destruction. Scattered atherosclerotic calcifications. IMPRESSION: Soft tissue swelling without acute osseous abnormalities. Electronically Signed   By: Lavonia Dana M.D.   On: 10/14/2020 12:36    Procedures Procedures (including critical care time)  Medications Ordered in UC Medications  rabies vaccine (RABAVERT) injection 1 mL (1 mL Intramuscular Given 10/14/20 1135)    Initial Impression / Assessment and Plan / UC Course  I have reviewed the triage vital signs and the nursing notes.  Pertinent labs & imaging results that were available during my care of the patient were reviewed by me and considered in my medical decision making (see chart for details).     Dog bite to R  calf Cellulitis of R calf Injury to L lower leg Pain and swelling to L lower leg  Xray of L leg negative for any fractures or misalignments Continue to apply ice, elevate the leg, and may wrap with an ace wrap for compression to try to help the swelling  Cellulitis of R leg Prescribed doxycycline 100mg  BID x 7 days Prescribed metronidazole 500mg  TID x 7 days Dual antibiotic therapy necessary due to PCN allergy Take these medications as directed and to completion May continue home medication regimen Follow up with the ER for any increased redness, swelling, heat, drainage from the area, high fever, red streaking up the leg, other concerning symptoms  Follow up with this office or with PCP Friday to check on wound healing  Verbalized understanding and agrees with treatment plan  Final Clinical Impressions(s) / UC Diagnoses   Final diagnoses:  Dog bite of calf, right, sequela  Cellulitis of right lower extremity  Pain of left lower extremity due to injury     Discharge Instructions     I have sent in doxycycline for you to take twice a day for 7 days  I have sent in metronidazole for you to take three times per day for 7 days  Xray negative for any fractures or misalignments  Follow up with this office or with primary care if symptoms are persisting.  Follow up in the ER for high fever, trouble swallowing, trouble breathing, other concerning symptoms.  ED Prescriptions    Medication Sig Dispense Auth. Provider   doxycycline (VIBRAMYCIN) 100 MG capsule Take 1 capsule (100 mg total) by mouth 2 (two) times daily. 14 capsule Faustino Congress, NP   metroNIDAZOLE (FLAGYL) 500 MG tablet Take 1 tablet (500 mg total) by mouth 3 (three) times daily for 7 days. 21 tablet Faustino Congress, NP     PDMP not reviewed this encounter.   Faustino Congress, NP 10/15/20 [phone]

## 2020-10-21 ENCOUNTER — Other Ambulatory Visit: Payer: Self-pay

## 2020-10-21 ENCOUNTER — Emergency Department: Payer: Medicare Other

## 2020-10-21 ENCOUNTER — Emergency Department
Admission: EM | Admit: 2020-10-21 | Discharge: 2020-10-21 | Disposition: A | Discharge status: Home / Self Care | Payer: Medicare Other | Attending: Emergency Medicine | Admitting: Emergency Medicine

## 2020-10-21 DIAGNOSIS — S8012XD Contusion of left lower leg, subsequent encounter: Secondary | ICD-10-CM | POA: Insufficient documentation

## 2020-10-21 DIAGNOSIS — L02415 Cutaneous abscess of right lower limb: Secondary | ICD-10-CM | POA: Diagnosis not present

## 2020-10-21 DIAGNOSIS — W540XXD Bitten by dog, subsequent encounter: Secondary | ICD-10-CM | POA: Diagnosis not present

## 2020-10-21 DIAGNOSIS — I10 Essential (primary) hypertension: Secondary | ICD-10-CM | POA: Insufficient documentation

## 2020-10-21 DIAGNOSIS — Z85828 Personal history of other malignant neoplasm of skin: Secondary | ICD-10-CM | POA: Diagnosis not present

## 2020-10-21 DIAGNOSIS — S81851D Open bite, right lower leg, subsequent encounter: Secondary | ICD-10-CM | POA: Insufficient documentation

## 2020-10-21 DIAGNOSIS — Z79899 Other long term (current) drug therapy: Secondary | ICD-10-CM | POA: Diagnosis not present

## 2020-10-21 DIAGNOSIS — Z87891 Personal history of nicotine dependence: Secondary | ICD-10-CM | POA: Diagnosis not present

## 2020-10-21 LAB — CBC WITH DIFFERENTIAL/PLATELET
Abs Immature Granulocytes: 0.02 10*3/uL (ref 0.00–0.07)
Basophils Absolute: 0 10*3/uL (ref 0.0–0.1)
Basophils Relative: 1 %
Eosinophils Absolute: 0 10*3/uL (ref 0.0–0.5)
Eosinophils Relative: 0 %
HCT: 33.6 % — ABNORMAL LOW (ref 36.0–46.0)
Hemoglobin: 11.5 g/dL — ABNORMAL LOW (ref 12.0–15.0)
Immature Granulocytes: 0 %
Lymphocytes Relative: 17 %
Lymphs Abs: 1 10*3/uL (ref 0.7–4.0)
MCH: 32.6 pg (ref 26.0–34.0)
MCHC: 34.2 g/dL (ref 30.0–36.0)
MCV: 95.2 fL (ref 80.0–100.0)
Monocytes Absolute: 0.8 10*3/uL (ref 0.1–1.0)
Monocytes Relative: 14 %
Neutro Abs: 3.8 10*3/uL (ref 1.7–7.7)
Neutrophils Relative %: 68 %
Platelets: 306 10*3/uL (ref 150–400)
RBC: 3.53 MIL/uL — ABNORMAL LOW (ref 3.87–5.11)
RDW: 12.3 % (ref 11.5–15.5)
WBC: 5.6 10*3/uL (ref 4.0–10.5)
nRBC: 0 % (ref 0.0–0.2)

## 2020-10-21 LAB — BASIC METABOLIC PANEL
Anion gap: 5 (ref 5–15)
BUN: 13 mg/dL (ref 8–23)
CO2: 27 mmol/L (ref 22–32)
Calcium: 8.8 mg/dL — ABNORMAL LOW (ref 8.9–10.3)
Chloride: 94 mmol/L — ABNORMAL LOW (ref 98–111)
Creatinine, Ser: 0.5 mg/dL (ref 0.44–1.00)
GFR, Estimated: >60 mL/min (ref >60)
Glucose, Bld: 108 mg/dL — ABNORMAL HIGH (ref 70–99)
Potassium: 4.2 mmol/L (ref 3.5–5.1)
Sodium: 126 mmol/L — ABNORMAL LOW (ref 135–145)

## 2020-10-21 MED ORDER — LIDOCAINE-EPINEPHRINE 2 %-1:100000 IJ SOLN
20.0000 mL | Freq: Once | INTRAMUSCULAR | Status: AC
  Administered 2020-10-21: 20 mL
  Filled 2020-10-21: qty 1

## 2020-10-21 NOTE — ED Provider Notes (Signed)
[facility] Emergency Department Provider Note   ____________________________________________   First MD Initiated Contact with Patient 10/21/20 1012     (approximate)  I have reviewed the triage vital signs and the nursing notes.   HISTORY  Chief Complaint Wound Infection    HPI Cathy Newman is a 86 y.o. female with past medical history of hypertension and hyperlipidemia who presents to the ED complaining of wound infection.  Patient reports that she was initially bit by a dog on the right calf on 10/25, was put on a course of Augmentin at that time.  She was able to take the antibiotics for a couple of days before she developed diffuse hives and facial swelling.  She subsequently stopped the antibiotic before completing the full course.  She was then seen at urgent care for follow-up rabies vaccination, at which point the wound was found to be red and swollen.  She was started on doxycycline and Flagyl 1 week ago with improvement, but when seen by her PCP today for follow-up they noticed the area seemed more red and swollen.  There was concern for abscess and patient was sent to the ED for further evaluation.  Additionally, patient states she hit her left shin on a cabinet a few days ago, has had significant swelling to her left shin and her left lower leg since then.  She does not take a blood thinner, was previously taking baby aspirin but has since stopped this.  Her left lower leg was x-rayed at urgent care 1 week ago, which was negative.  Pain and swelling has persisted since then.        Past Medical History:  Diagnosis Date  . Cancer ([place])    melanoma on knee  . Glaucoma     Patient Active Problem List   Diagnosis Date Noted  . Pain due to onychomycosis of toenails of both feet 05/28/2019  . Hyponatremia 02/17/2019  . Acute hyponatremia 02/08/2019  . HTN, goal below 140/80 02/06/2019  . Atrophic vaginitis 02/10/2015  . Glaucoma 02/10/2015    . HLD (hyperlipidemia) 02/10/2015  . Hypoglycemia 02/10/2015  . Malignant melanoma of knee ([place]) 02/10/2015  . Acne erythematosa 02/10/2015  . Thyroid nodule 02/10/2015  . Below normal amount of sodium in the blood 02/10/2015  . Abnormal blood sugar 10/28/2014  . Abnormal glucose level 10/28/2014  . Acute infection of nasal sinus 07/19/2014  . Female genuine stress incontinence 05/01/2014  . Prolapse of urethra 05/01/2014  . Urge incontinence of urine 05/01/2014    Past Surgical History:  Procedure Laterality Date  . ABDOMINAL HYSTERECTOMY    . BREAST BIOPSY Right   . CYSTECTOMY    . RECTAL PROLAPSE REPAIR    . VAGINAL PROLAPSE REPAIR      Prior to Admission medications   Medication Sig Start Date End Date Taking? Authorizing Provider  amLODipine (NORVASC) 2.5 MG tablet Take by mouth. 09/19/20 09/19/21  [provider]  aspirin EC 81 MG tablet Take 81 mg by mouth daily.     [provider]  bimatoprost (LUMIGAN) 0.01 % SOLN Place 1 drop into both eyes at bedtime.     [provider]  doxycycline (VIBRAMYCIN) 100 MG capsule Take 1 capsule (100 mg total) by mouth 2 (two) times daily. 10/14/20   Faustino Congress, NP  estradiol (ESTRACE) 0.1 MG/GM vaginal cream Place vaginally. 09/19/20 09/19/21  [provider]  metroNIDAZOLE (FLAGYL) 500 MG tablet Take 1 tablet (500 mg total)  by mouth 3 (three) times daily for 7 days. 10/14/20 10/21/20  Faustino Congress, NP  propranolol ER (INDERAL LA) 60 MG 24 hr capsule Take 60 mg by mouth daily. 02/06/19   [provider]  RHOPRESSA 0.02 % SOLN Place 1 drop into the left eye at bedtime. 06/17/20   [provider]  SENNOSIDES PO Take by mouth.    [provider]  sodium chloride 1 g tablet Take 1 tablet (1 g total) by mouth 2 (two) times daily with a meal. 02/20/19   Vaughan Basta, MD  timolol (TIMOPTIC) 0.5 % ophthalmic solution Place 1 drop into both eyes 2 (two) times  daily.     [provider]    Allergies Amoxicillin, Brinzolamide, and Other  Family History  Problem Relation Age of Onset  . Breast cancer Sister   . Bladder Cancer Neg Hx   . Kidney cancer Neg Hx     Social History Social History   Tobacco Use  . Smoking status: Former Research scientist (life sciences)  . Smokeless tobacco: Never Used  Substance Use Topics  . Alcohol use: Not on file    Comment: occasionally  . Drug use: Never    Review of Systems  Constitutional: No fever/chills Eyes: No visual changes. ENT: No sore throat. Cardiovascular: Denies chest pain. Respiratory: Denies shortness of breath. Gastrointestinal: No abdominal pain.  No nausea, no vomiting.  No diarrhea.  No constipation. Genitourinary: Negative for dysuria. Musculoskeletal: Negative for back pain.  Positive for leg pain and swelling. Skin: Negative for rash. Neurological: Negative for headaches, focal weakness or numbness.  ____________________________________________   PHYSICAL EXAM:  VITAL SIGNS: ED Triage Vitals  Enc Vitals Group     BP 10/21/20 0959 (!) 162/65     Pulse Rate 10/21/20 0959 67     Resp 10/21/20 0959 18     Temp 10/21/20 0959 98.1 F (36.7 C)     Temp Source 10/21/20 0959 Oral     SpO2 10/21/20 0959 98 %     Weight 10/21/20 1000 108 lb (49 kg)     Height 10/21/20 1000 5' (1.524 m)     Head Circumference --      Peak Flow --      Pain Score 10/21/20 0959 0     Pain Loc --      Pain Edu? --      Excl. in [place]? --     Constitutional: Alert and oriented. Eyes: Conjunctivae are normal. Head: Atraumatic. Nose: No congestion/rhinnorhea. Mouth/Throat: Mucous membranes are moist. Neck: Normal ROM Cardiovascular: Normal rate, regular rhythm. Grossly normal heart sounds.  2+ radial pulses bilaterally. Respiratory: Normal respiratory effort.  No retractions. Lungs CTAB. Gastrointestinal: Soft and nontender. No distention. Genitourinary: deferred Musculoskeletal: Healing wound to  posterior right calf with central induration but no tenderness or warmth.  Left lower extremity with soft tissue edema to anterior shin with associated tenderness to palpation, no erythema or warmth.  1+ pitting edema extending down left lower leg with associated ecchymosis. Neurologic:  Normal speech and language. No gross focal neurologic deficits are appreciated. Skin:  Skin is warm, dry and intact. No rash noted. Psychiatric: Mood and affect are normal. Speech and behavior are normal.  ____________________________________________   LABS (all labs ordered are listed, but only abnormal results are displayed)  Labs Reviewed  BASIC METABOLIC PANEL - Abnormal; Notable for the following components:      Result Value   Sodium 126 (*)    Chloride 94 (*)  Glucose, Bld 108 (*)    Calcium 8.8 (*)    All other components within normal limits  CBC WITH DIFFERENTIAL/PLATELET - Abnormal; Notable for the following components:   RBC 3.53 (*)    Hemoglobin 11.5 (*)    HCT 33.6 (*)    All other components within normal limits     PROCEDURES  Procedure(s) performed (including Critical Care):  Marland KitchenMarland KitchenIncision and Drainage  Date/Time: 10/21/2020 1:57 PM Performed by: Blake Divine, MD Authorized by: Blake Divine, MD   Consent:    Consent obtained:  Verbal   Consent given by:  Patient and healthcare agent   Risks discussed:  Bleeding, incomplete drainage, pain, infection and damage to other organs   Alternatives discussed:  No treatment Location:    Type:  Abscess   Location:  Lower extremity   Lower extremity location:  Leg   Leg location:  R lower leg Pre-procedure details:    Skin preparation:  Chloraprep Anesthesia (see MAR for exact dosages):    Anesthesia method:  Local infiltration   Local anesthetic:  Lidocaine 2% WITH epi Procedure type:    Complexity:  Simple Procedure details:    Needle aspiration: no     Incision types:  Single straight   Scalpel blade:  11   Wound  management:  Probed and deloculated   Drainage:  Serosanguinous   Drainage amount:  Moderate   Wound treatment:  Wound left open   Packing materials:  None Post-procedure details:    Patient tolerance of procedure:  Tolerated well, no immediate complications     ____________________________________________   INITIAL IMPRESSION / ASSESSMENT AND PLAN / ED COURSE       86 year old female with past medical history of hypertension and hyperlipidemia who presents to the ED complaining of firm area to her posterior calf where she previously had a dog bite as well as pain and swelling to her left lower extremity after striking it on a cabinet 1 week ago.  There was concern for abscess to her right posterior leg and bedside ultrasound does show a fluid collection there, although infection overall appears to be improved.  This area would likely benefit from incision and drainage of any residual abscess.  Swollen portion to her left lower extremity appears most consistent with hematoma, although swelling has not improved over the course of the week.  We will check ultrasound to ensure no DVT, also check basic labs.  Ultrasound is negative for DVT, does show fluid collection to left lower anterior leg most consistent with a hematoma.  Very much doubt abscess or infectious process to her left lower extremity given onset after traumatic injury with no skin break.  We will hold off on incision and drainage of this fluid collection.  I&D was performed of fluid collection of her right lower extremity, which drained serosanguineous material, appears most consistent with old blood.  No purulence or evidence to suggest acute infection.  Additional course of antibiotics is medicated at this time and patient is appropriate for discharge home with PCP follow-up.  She and daughter were counseled to return to the ED for any new or worsening symptoms, patient agrees with plan.       ____________________________________________   FINAL CLINICAL IMPRESSION(S) / ED DIAGNOSES  Final diagnoses:  Dog bite, subsequent encounter  Hematoma of left lower extremity, subsequent encounter     ED Discharge Orders    None       Note:  This document was prepared using  [employer] Armed forces training and education officer and may include unintentional dictation errors.   Blake Divine, MD 10/21/20 [phone]

## 2020-10-21 NOTE — ED Notes (Signed)
Foley catheter documented under wrong pt by mistake. This pt did not have a foley placed.

## 2020-10-21 NOTE — ED Triage Notes (Signed)
Pt arrived to ed via pov, ambulatory to triage. Pt sent from [facility] for possible abscess under previous dog bite obtained on 10/25 on the lower rt leg. Pt has taken 2 antibiotics doxycycline and metronidazole, on last dose. Pt and daughter concerned there is an abscess under wound. Pt also hit left shin on furniture, swelling and bruising noted. Daughter report xray taken on 11/9, no fracture noted on xray. Pt a&o x 4. NAD noted

## 2020-10-21 NOTE — ED Notes (Signed)
Pt's leg wrapped in ace bandage and kerlix. Pt tolerated well. Daughter to take pt home. Pt ambulated to lobby.

## 2020-12-04 ENCOUNTER — Ambulatory Visit (INDEPENDENT_AMBULATORY_CARE_PROVIDER_SITE_OTHER): Payer: Medicare Other | Admitting: Podiatry

## 2020-12-04 ENCOUNTER — Encounter: Payer: Self-pay | Admitting: Podiatry

## 2020-12-04 ENCOUNTER — Other Ambulatory Visit: Payer: Self-pay

## 2020-12-04 DIAGNOSIS — M79675 Pain in left toe(s): Secondary | ICD-10-CM

## 2020-12-04 DIAGNOSIS — M79674 Pain in right toe(s): Secondary | ICD-10-CM | POA: Diagnosis not present

## 2020-12-04 DIAGNOSIS — B351 Tinea unguium: Secondary | ICD-10-CM | POA: Diagnosis not present

## 2020-12-04 DIAGNOSIS — L608 Other nail disorders: Secondary | ICD-10-CM | POA: Diagnosis not present

## 2020-12-04 NOTE — Progress Notes (Signed)
This patient returns to the office for evaluation and treatment of long thick painful nails .  This patient is unable to trim her own nails since the patient cannot reach her feet.  Patient says the nails are painful walking and wearing her shoes. She returns for preventive foot care services.   General Appearance  Alert, conversant and in no acute stress.  Vascular  Dorsalis pedis and posterior tibial  pulses are palpable  bilaterally.  Capillary return is within normal limits  bilaterally. Temperature is within normal limits  bilaterally.  Neurologic  Senn-Weinstein monofilament wire test within normal limits  bilaterally. Muscle power within normal limits bilaterally.  Nails Thick disfigured discolored nails with subungual debris  from hallux to fifth toes bilaterally. No evidence of bacterial infection or drainage bilaterally.  Orthopedic  No limitations of motion  feet .  No crepitus or effusions noted.  No bony pathology or digital deformities noted.  Skin  normotropic skin with no porokeratosis noted bilaterally.  No signs of infections or ulcers noted. HAV  B/L.    Onychomycosis  Pain in toes right foot  Pain in toes left foot  Ingrown nail lateral border right great toe.  Debridement  of nails  1-5  B/L with a nail nipper.  Nails were then filed using a dremel tool with no incidents.    RTC 10 weeks  Patient cannot handle treatment of medial and lateral borders right hallux being treated.   Helane Gunther DPM

## 2021-02-12 ENCOUNTER — Ambulatory Visit (INDEPENDENT_AMBULATORY_CARE_PROVIDER_SITE_OTHER): Payer: Medicare Other | Admitting: Podiatry

## 2021-02-12 ENCOUNTER — Other Ambulatory Visit: Payer: Self-pay

## 2021-02-12 ENCOUNTER — Encounter: Payer: Self-pay | Admitting: Podiatry

## 2021-02-12 DIAGNOSIS — L608 Other nail disorders: Secondary | ICD-10-CM

## 2021-02-12 DIAGNOSIS — M79675 Pain in left toe(s): Secondary | ICD-10-CM | POA: Diagnosis not present

## 2021-02-12 DIAGNOSIS — M79674 Pain in right toe(s): Secondary | ICD-10-CM | POA: Diagnosis not present

## 2021-02-12 DIAGNOSIS — B351 Tinea unguium: Secondary | ICD-10-CM | POA: Diagnosis not present

## 2021-02-12 NOTE — Progress Notes (Signed)
This patient returns to the office for evaluation and treatment of long thick painful nails .  This patient is unable to trim her own nails since the patient cannot reach her feet.  Patient says the nails are painful walking and wearing her shoes. She returns for preventive foot care services.   General Appearance  Alert, conversant and in no acute stress.  Vascular  Dorsalis pedis and posterior tibial  pulses are weakly  palpable  bilaterally.  Capillary return is within normal limits  bilaterally. Cold feet  Bilaterally. Absent digital hair.  Neurologic  Senn-Weinstein monofilament wire test within normal limits  bilaterally. Muscle power within normal limits bilaterally.  Nails Thick disfigured discolored nails with subungual debris  from hallux to fifth toes bilaterally. No evidence of bacterial infection or drainage bilaterally.  Orthopedic  No limitations of motion  feet .  No crepitus or effusions noted.  No bony pathology or digital deformities noted.  Skin  normotropic skin with no porokeratosis noted bilaterally.  No signs of infections or ulcers noted. HAV  B/L.    Onychomycosis  Pain in toes right foot  Pain in toes left foot  Ingrown nail lateral border right great toe.  Debridement  of nails  1-5  B/L with a nail nipper.  Nails were then filed using a dremel tool with no incidents.    RTC 10 weeks  Patient cannot handle treatment of medial and lateral borders right hallux being treated.   Gardiner Barefoot DPM

## 2021-04-23 ENCOUNTER — Ambulatory Visit (INDEPENDENT_AMBULATORY_CARE_PROVIDER_SITE_OTHER): Payer: Medicare Other | Admitting: Podiatry

## 2021-04-23 ENCOUNTER — Encounter: Payer: Self-pay | Admitting: Podiatry

## 2021-04-23 ENCOUNTER — Other Ambulatory Visit: Payer: Self-pay

## 2021-04-23 DIAGNOSIS — M79674 Pain in right toe(s): Secondary | ICD-10-CM | POA: Diagnosis not present

## 2021-04-23 DIAGNOSIS — B351 Tinea unguium: Secondary | ICD-10-CM | POA: Diagnosis not present

## 2021-04-23 DIAGNOSIS — L608 Other nail disorders: Secondary | ICD-10-CM

## 2021-04-23 DIAGNOSIS — M79675 Pain in left toe(s): Secondary | ICD-10-CM | POA: Diagnosis not present

## 2021-04-23 NOTE — Progress Notes (Signed)
This patient returns to the office for evaluation and treatment of long thick painful nails .  This patient is unable to trim her own nails since the patient cannot reach her feet.  Patient says the nails are painful walking and wearing her shoes. She returns for preventive foot care services.   General Appearance  Alert, conversant and in no acute stress.  Vascular  Dorsalis pedis and posterior tibial  pulses are weakly  palpable  bilaterally.  Capillary return is within normal limits  bilaterally. Cold feet  Bilaterally. Absent digital hair.  Neurologic  Senn-Weinstein monofilament wire test within normal limits  bilaterally. Muscle power within normal limits bilaterally.  Nails Thick disfigured discolored nails with subungual debris  from hallux to fifth toes bilaterally. No evidence of bacterial infection or drainage bilaterally.  Orthopedic  No limitations of motion  feet .  No crepitus or effusions noted.  No bony pathology or digital deformities noted.  Skin  normotropic skin with no porokeratosis noted bilaterally.  No signs of infections or ulcers noted. HAV  B/L.    Onychomycosis  Pain in toes right foot  Pain in toes left foot  Ingrown nail lateral border right great toe.  Debridement  of nails  1-5  B/L with a nail nipper.  Nails were then filed using a dremel tool with no incidents.    RTC 10 weeks  Patient cannot handle treatment of medial and lateral borders right hallux being treated.   Cathy Newman DPM  

## 2021-07-02 ENCOUNTER — Encounter: Payer: Self-pay | Admitting: Podiatry

## 2021-07-02 ENCOUNTER — Other Ambulatory Visit: Payer: Self-pay

## 2021-07-02 ENCOUNTER — Ambulatory Visit (INDEPENDENT_AMBULATORY_CARE_PROVIDER_SITE_OTHER): Payer: Medicare Other | Admitting: Podiatry

## 2021-07-02 DIAGNOSIS — L608 Other nail disorders: Secondary | ICD-10-CM | POA: Diagnosis not present

## 2021-07-02 DIAGNOSIS — M79675 Pain in left toe(s): Secondary | ICD-10-CM

## 2021-07-02 DIAGNOSIS — B351 Tinea unguium: Secondary | ICD-10-CM

## 2021-07-02 DIAGNOSIS — M79674 Pain in right toe(s): Secondary | ICD-10-CM | POA: Diagnosis not present

## 2021-07-02 NOTE — Progress Notes (Signed)
This patient returns to the office for evaluation and treatment of long thick painful nails .  This patient is unable to trim her own nails since the patient cannot reach her feet.  Patient says the nails are painful walking and wearing her shoes. She returns for preventive foot care services.   General Appearance  Alert, conversant and in no acute stress.  Vascular  Dorsalis pedis and posterior tibial  pulses are weakly  palpable  bilaterally.  Capillary return is within normal limits  bilaterally. Cold feet  Bilaterally. Absent digital hair.  Neurologic  Senn-Weinstein monofilament wire test within normal limits  bilaterally. Muscle power within normal limits bilaterally.  Nails Thick disfigured discolored nails with subungual debris  from hallux to fifth toes bilaterally. No evidence of bacterial infection or drainage bilaterally.  Orthopedic  No limitations of motion  feet .  No crepitus or effusions noted.  No bony pathology or digital deformities noted.  Skin  normotropic skin with no porokeratosis noted bilaterally.  No signs of infections or ulcers noted. HAV  B/L.    Onychomycosis Painful ingrown toenails hallux  B/L.  Debridement  of nails  1-5  B/L with a nail nipper.  Nails were then filed using a dremel tool with no incidents.    RTC 10  weeks     Jamiah Recore DPM  

## 2021-09-12 IMAGING — DX DG TIBIA/FIBULA 2V*L*
4 series · 4 of 4 positions shown · non-contrast
Comparison: None at

CLINICAL DATA: LEFT lower extremity pain and swelling, struck leg
on table at home and began having swelling and bruising, no relief
with ice

EXAM:
LEFT TIBIA AND FIBULA - 2 VIEW

[tib/fib ap (1 of 2)]
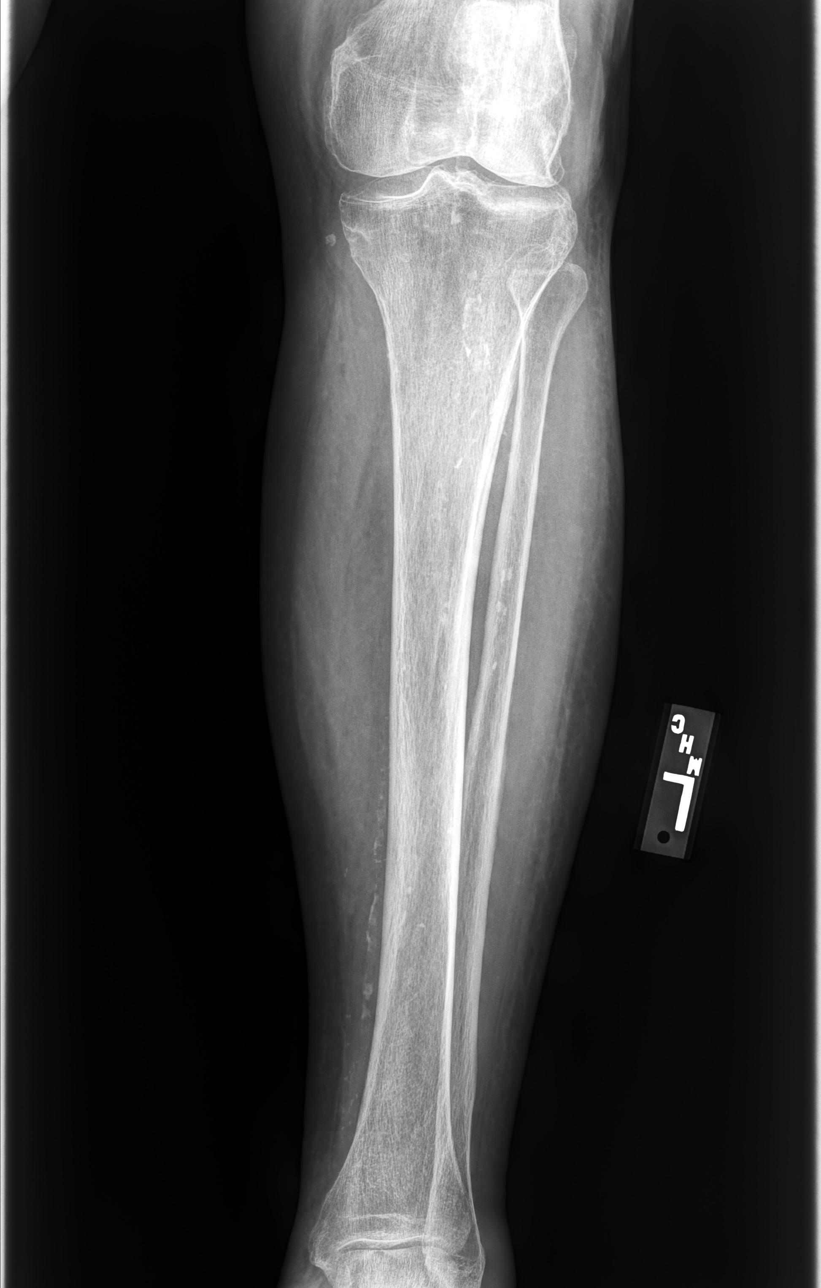

[tib/fib lat (1 of 2)]
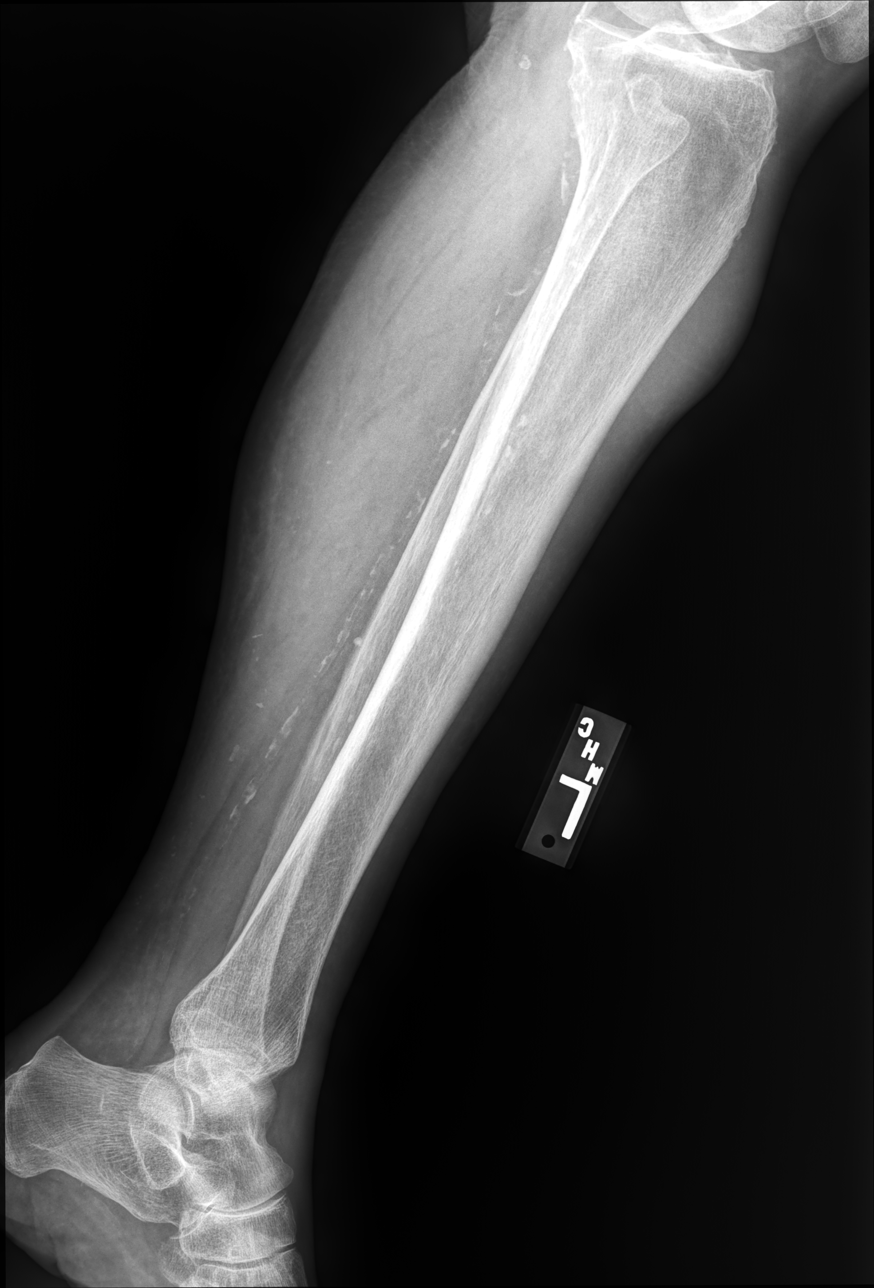

[tib/fib ap (2 of 2)]
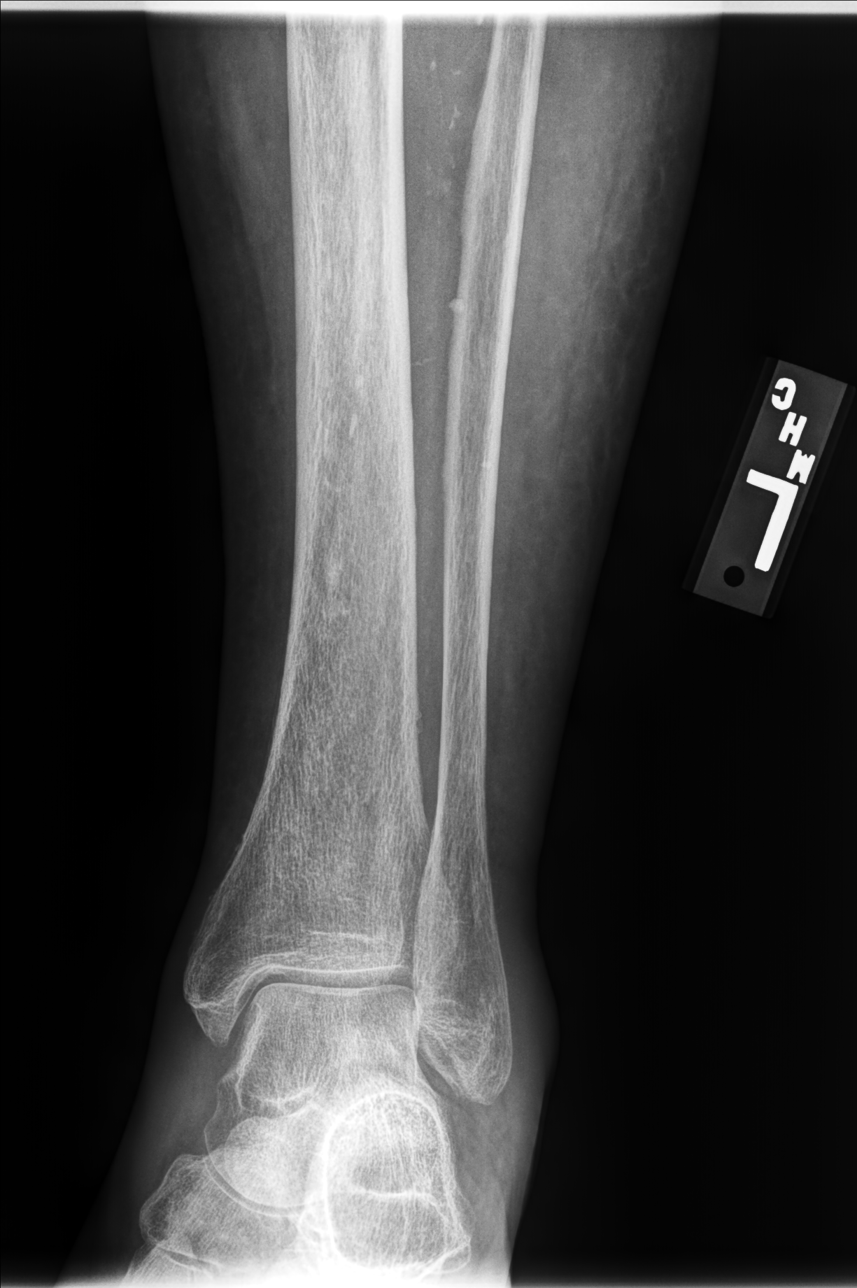

[tib/fib lat (2 of 2)]
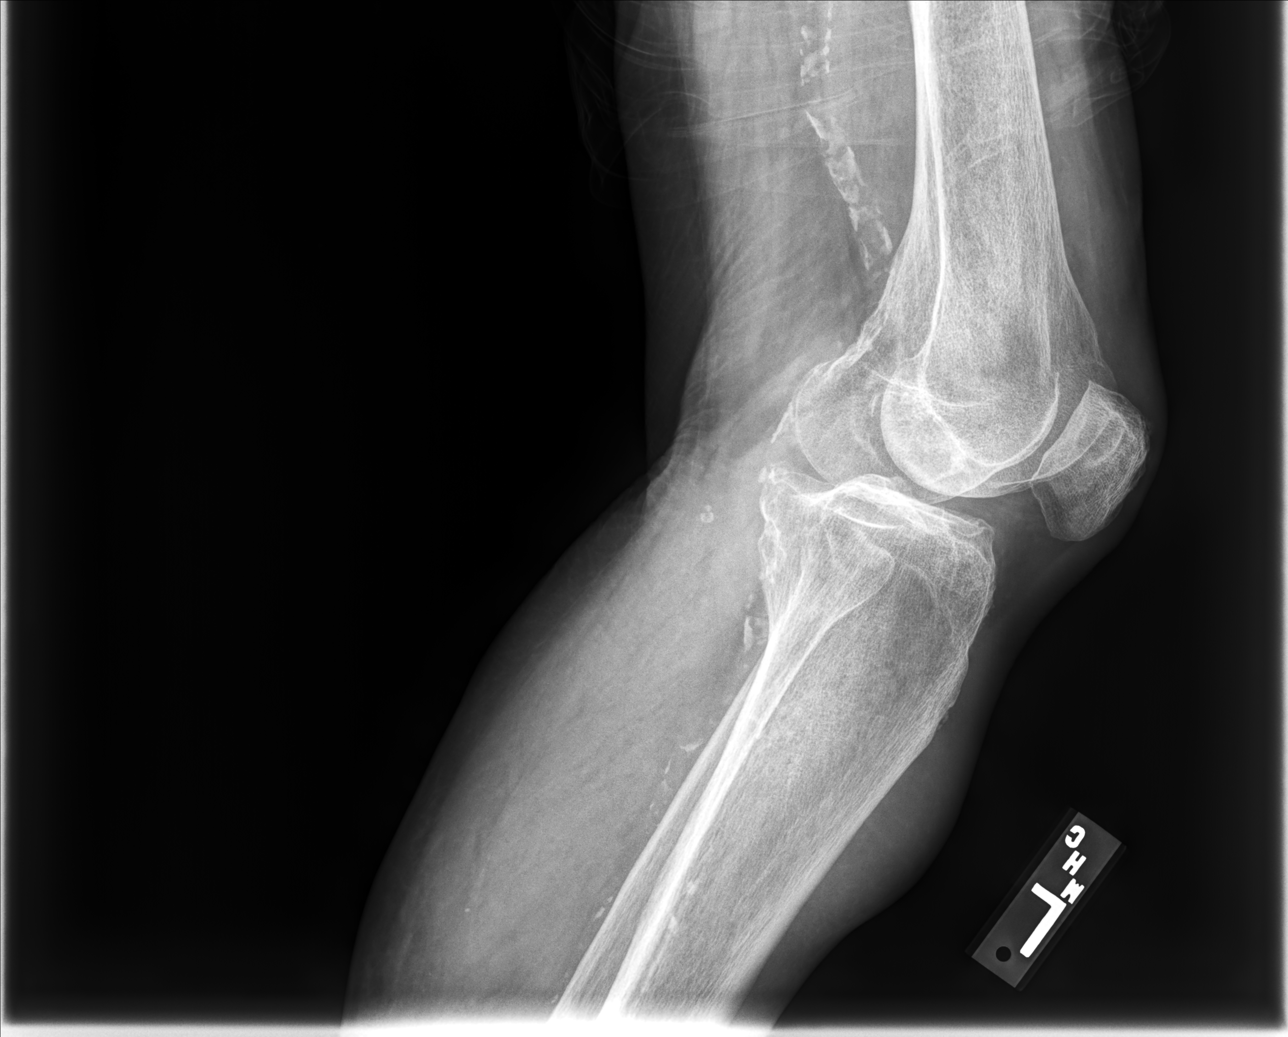

[4 of 4 positions shown; findings below may reference images not displayed]

FINDINGS: Osseous demineralization.

Knee and ankle joint alignments normal.

Pretibial soft tissue swelling at proximal lower leg with additional
scattered edema at the lateral mid and distal lower leg to ankle.

No acute fracture, dislocation, or bone destruction.

Scattered atherosclerotic calcifications.
IMPRESSION: Soft tissue swelling without acute osseous abnormalities.

## 2021-09-17 ENCOUNTER — Other Ambulatory Visit: Payer: Self-pay | Admitting: Internal Medicine

## 2021-09-17 ENCOUNTER — Ambulatory Visit
Admission: RE | Admit: 2021-09-17 | Discharge: 2021-09-17 | Disposition: A | Discharge status: Home / Self Care | Payer: Medicare Other | Source: Ambulatory Visit | Attending: Internal Medicine | Admitting: Internal Medicine

## 2021-09-17 ENCOUNTER — Other Ambulatory Visit: Payer: Self-pay

## 2021-09-17 DIAGNOSIS — M79604 Pain in right leg: Secondary | ICD-10-CM | POA: Insufficient documentation

## 2021-09-17 DIAGNOSIS — M7989 Other specified soft tissue disorders: Secondary | ICD-10-CM | POA: Diagnosis present

## 2021-09-24 ENCOUNTER — Ambulatory Visit: Payer: Medicare Other | Admitting: Podiatry

## 2022-02-25 ENCOUNTER — Ambulatory Visit (INDEPENDENT_AMBULATORY_CARE_PROVIDER_SITE_OTHER): Payer: Medicare Other | Admitting: Podiatry

## 2022-02-25 ENCOUNTER — Other Ambulatory Visit: Payer: Self-pay

## 2022-02-25 ENCOUNTER — Encounter: Payer: Self-pay | Admitting: Podiatry

## 2022-02-25 DIAGNOSIS — B351 Tinea unguium: Secondary | ICD-10-CM

## 2022-02-25 DIAGNOSIS — M79675 Pain in left toe(s): Secondary | ICD-10-CM

## 2022-02-25 DIAGNOSIS — M79674 Pain in right toe(s): Secondary | ICD-10-CM

## 2022-02-25 DIAGNOSIS — L608 Other nail disorders: Secondary | ICD-10-CM

## 2022-02-25 NOTE — Progress Notes (Signed)
This patient returns to the office for evaluation and treatment of long thick painful nails .  This patient is unable to trim her own nails since the patient cannot reach her feet.  Patient says the nails are painful walking and wearing her shoes. She returns for preventive foot care services.  ? ?General Appearance  Alert, conversant and in no acute stress. ? ?Vascular  Dorsalis pedis and posterior tibial  pulses are weakly  palpable  bilaterally.  Capillary return is within normal limits  bilaterally. Cold feet  Bilaterally. Absent digital hair. ? ?Neurologic  Senn-Weinstein monofilament wire test within normal limits  bilaterally. Muscle power within normal limits bilaterally. ? ?Nails Thick disfigured discolored nails with subungual debris  from hallux to fifth toes bilaterally. No evidence of bacterial infection or drainage bilaterally. ? ?Orthopedic  No limitations of motion  feet .  No crepitus or effusions noted.  No bony pathology or digital deformities noted. ? ?Skin  normotropic skin with no porokeratosis noted bilaterally.  No signs of infections or ulcers noted. HAV  B/L.   ? ?Onychomycosis Painful ingrown toenails hallux  B/L. ? ?Debridement  of nails  1-5  B/L with a nail nipper.  Nails were then filed using a dremel tool with no incidents.    RTC 12  weeks   ? ? ?Gardiner Barefoot DPM  ?

## 2022-05-27 ENCOUNTER — Ambulatory Visit (INDEPENDENT_AMBULATORY_CARE_PROVIDER_SITE_OTHER): Payer: Medicare Other | Admitting: Podiatry

## 2022-05-27 ENCOUNTER — Encounter: Payer: Self-pay | Admitting: Podiatry

## 2022-05-27 DIAGNOSIS — M79675 Pain in left toe(s): Secondary | ICD-10-CM

## 2022-05-27 DIAGNOSIS — B351 Tinea unguium: Secondary | ICD-10-CM

## 2022-05-27 DIAGNOSIS — M21619 Bunion of unspecified foot: Secondary | ICD-10-CM | POA: Diagnosis not present

## 2022-05-27 DIAGNOSIS — L608 Other nail disorders: Secondary | ICD-10-CM | POA: Diagnosis not present

## 2022-05-27 DIAGNOSIS — M79674 Pain in right toe(s): Secondary | ICD-10-CM

## 2022-05-27 NOTE — Progress Notes (Signed)
This patient returns to the office for evaluation and treatment of long thick painful nails .  This patient is unable to trim her own nails since the patient cannot reach her feet.  Patient says the nails are painful walking and wearing her shoes. She returns for preventive foot care services.   General Appearance  Alert, conversant and in no acute stress.  Vascular  Dorsalis pedis and posterior tibial  pulses are weakly  palpable  bilaterally.  Capillary return is within normal limits  bilaterally. Cold feet  Bilaterally. Absent digital hair.  Neurologic  Senn-Weinstein monofilament wire test within normal limits  bilaterally. Muscle power within normal limits bilaterally.  Nails Thick disfigured discolored nails with subungual debris  from hallux to fifth toes bilaterally. No evidence of bacterial infection or drainage bilaterally.  Orthopedic  No limitations of motion  feet .  No crepitus or effusions noted.  No bony pathology or digital deformities noted.  Skin  normotropic skin with no porokeratosis noted bilaterally.  No signs of infections or ulcers noted. HAV  B/L.    Onychomycosis Painful ingrown toenails hallux  B/L.  Debridement  of nails  1-5  B/L with a nail nipper.  Nails were then filed using a dremel tool with no incidents.    RTC 12  weeks     Gardiner Barefoot DPM

## 2022-09-02 ENCOUNTER — Encounter: Payer: Self-pay | Admitting: Podiatry

## 2022-09-02 ENCOUNTER — Ambulatory Visit (INDEPENDENT_AMBULATORY_CARE_PROVIDER_SITE_OTHER): Payer: Medicare Other | Admitting: Podiatry

## 2022-09-02 DIAGNOSIS — B351 Tinea unguium: Secondary | ICD-10-CM | POA: Diagnosis not present

## 2022-09-02 DIAGNOSIS — M79674 Pain in right toe(s): Secondary | ICD-10-CM

## 2022-09-02 DIAGNOSIS — M79675 Pain in left toe(s): Secondary | ICD-10-CM | POA: Diagnosis not present

## 2022-09-02 DIAGNOSIS — M21619 Bunion of unspecified foot: Secondary | ICD-10-CM

## 2022-09-02 NOTE — Progress Notes (Signed)
This patient returns to the office for evaluation and treatment of long thick painful nails .  This patient is unable to trim her own nails since the patient cannot reach her feet.  Patient says the nails are painful walking and wearing her shoes. She returns for preventive foot care services.   General Appearance  Alert, conversant and in no acute stress.  Vascular  Dorsalis pedis and posterior tibial  pulses are weakly  palpable  bilaterally.  Capillary return is within normal limits  bilaterally. Cold feet  Bilaterally. Absent digital hair.  Neurologic  Senn-Weinstein monofilament wire test within normal limits  bilaterally. Muscle power within normal limits bilaterally.  Nails Thick disfigured discolored nails with subungual debris  from hallux to fifth toes bilaterally. No evidence of bacterial infection or drainage bilaterally.  Orthopedic  No limitations of motion  feet .  No crepitus or effusions noted.  No bony pathology or digital deformities noted.  Skin  normotropic skin with no porokeratosis noted bilaterally.  No signs of infections or ulcers noted. HAV  B/L.    Onychomycosis Painful ingrown toenails hallux  B/L.  Debridement  of nails  1-5  B/L with a nail nipper.  Nails were then filed using a dremel tool with no incidents.    RTC 10  weeks     Sandy Blouch DPM  

## 2022-11-22 ENCOUNTER — Ambulatory Visit (INDEPENDENT_AMBULATORY_CARE_PROVIDER_SITE_OTHER): Payer: Medicare Other | Admitting: Podiatry

## 2022-11-22 ENCOUNTER — Encounter: Payer: Self-pay | Admitting: Podiatry

## 2022-11-22 VITALS — BP 148/77

## 2022-11-22 DIAGNOSIS — M21619 Bunion of unspecified foot: Secondary | ICD-10-CM

## 2022-11-22 DIAGNOSIS — B351 Tinea unguium: Secondary | ICD-10-CM | POA: Diagnosis not present

## 2022-11-22 DIAGNOSIS — M79674 Pain in right toe(s): Secondary | ICD-10-CM

## 2022-11-22 DIAGNOSIS — M79675 Pain in left toe(s): Secondary | ICD-10-CM | POA: Diagnosis not present

## 2022-11-22 DIAGNOSIS — L608 Other nail disorders: Secondary | ICD-10-CM

## 2022-11-22 NOTE — Progress Notes (Signed)
This patient returns to the office for evaluation and treatment of long thick painful nails .  This patient is unable to trim her own nails since the patient cannot reach her feet.  Patient says the nails are painful walking and wearing her shoes. She returns for preventive foot care services.   General Appearance  Alert, conversant and in no acute stress.  Vascular  Dorsalis pedis and posterior tibial  pulses are weakly  palpable  bilaterally.  Capillary return is within normal limits  bilaterally. Cold feet  Bilaterally. Absent digital hair.  Neurologic  Senn-Weinstein monofilament wire test within normal limits  bilaterally. Muscle power within normal limits bilaterally.  Nails Thick disfigured discolored nails with subungual debris  from hallux to fifth toes bilaterally. No evidence of bacterial infection or drainage bilaterally.  Orthopedic  No limitations of motion  feet .  No crepitus or effusions noted.  No bony pathology or digital deformities noted.  Skin  normotropic skin with no porokeratosis noted bilaterally.  No signs of infections or ulcers noted. HAV  B/L.    Onychomycosis Painful ingrown toenails hallux  B/L.  Debridement  of nails  1-5  B/L with a nail nipper.  Nails were then filed using a dremel tool with no incidents.    RTC 10  weeks     Gardiner Barefoot DPM

## 2023-02-03 ENCOUNTER — Encounter: Payer: Self-pay | Admitting: Podiatry

## 2023-02-03 ENCOUNTER — Ambulatory Visit (INDEPENDENT_AMBULATORY_CARE_PROVIDER_SITE_OTHER): Payer: Medicare Other | Admitting: Podiatry

## 2023-02-03 DIAGNOSIS — M79674 Pain in right toe(s): Secondary | ICD-10-CM

## 2023-02-03 DIAGNOSIS — L608 Other nail disorders: Secondary | ICD-10-CM

## 2023-02-03 DIAGNOSIS — B351 Tinea unguium: Secondary | ICD-10-CM | POA: Diagnosis not present

## 2023-02-03 DIAGNOSIS — M79675 Pain in left toe(s): Secondary | ICD-10-CM

## 2023-02-03 NOTE — Progress Notes (Signed)
This patient returns to the office for evaluation and treatment of long thick painful nails .  This patient is unable to trim her own nails since the patient cannot reach her feet.  Patient says the nails are painful walking and wearing her shoes. She returns for preventive foot care services. Severe purple discoloration right leg.  Saw doctor this morning.  General Appearance  Alert, conversant and in no acute stress.  Vascular  Dorsalis pedis and posterior tibial  pulses are weakly  palpable  bilaterally.  Capillary return is within normal limits  bilaterally. Cold feet  Bilaterally. Absent digital hair.  Neurologic  Senn-Weinstein monofilament wire test within normal limits  bilaterally. Muscle power within normal limits bilaterally.  Nails Thick disfigured discolored nails with subungual debris  from hallux to fifth toes bilaterally. No evidence of bacterial infection or drainage bilaterally.  Orthopedic  No limitations of motion  feet .  No crepitus or effusions noted.  No bony pathology or digital deformities noted. Swollen right foot with ecchymosis.  Skin  normotropic skin with no porokeratosis noted bilaterally.  No signs of infections or ulcers noted. HAV  B/L.    Onychomycosis Painful ingrown toenails hallux  B/L.  Debridement  of nails  1-5  B/L with a nail nipper.  Nails were then filed using a dremel tool with no incidents.    RTC 10  weeks     Gardiner Barefoot DPM

## 2023-04-14 ENCOUNTER — Ambulatory Visit (INDEPENDENT_AMBULATORY_CARE_PROVIDER_SITE_OTHER): Payer: Medicare Other | Admitting: Podiatry

## 2023-04-14 DIAGNOSIS — M79675 Pain in left toe(s): Secondary | ICD-10-CM | POA: Diagnosis not present

## 2023-04-14 DIAGNOSIS — B351 Tinea unguium: Secondary | ICD-10-CM

## 2023-04-14 DIAGNOSIS — M79674 Pain in right toe(s): Secondary | ICD-10-CM | POA: Diagnosis not present

## 2023-04-19 ENCOUNTER — Encounter: Payer: Self-pay | Admitting: Podiatry

## 2023-04-19 NOTE — Progress Notes (Signed)
  Subjective:  Patient ID: Cathy Newman, female    DOB: 1925-10-09,  MRN: [id number]  Rajene Quinter presents to clinic today for painful elongated mycotic toenails 1-5 bilaterally which are tender when wearing enclosed shoe gear. Pain is relieved with periodic professional debridement. His daughter is present during today's visit.  Chief Complaint  Patient presents with   Nail Problem    RFC,PCP:   Marguarite Arbour, MD , PCPLOV:05/24    New problem(s): None.   PCP is Sparks, Duane Lope, MD.  Allergies  Allergen Reactions   Amoxicillin Hives and Anaphylaxis   Brinzolamide Other (See Comments)   Other Other (See Comments)    MYCINS MYCINS    Review of Systems: Negative except as noted in the HPI.  Objective: No changes noted in today's physical examination. There were no vitals filed for this visit. Cathy Newman is a pleasant 89 y.o. female thin build in NAD. AAO x 3.  Vascular Examination: CFT <3 seconds b/l. DP pulses faintly palpable b/l. PT pulses palpable b/l. Digital hair absent. Skin temperature gradient warm to cool b/l. No pain with calf compression. No ischemia or gangrene. Trace edema RLE.  Neurological Examination: Sensation grossly intact b/l with 10 gram monofilament. Vibratory sensation intact b/l.   Dermatological Examination: Pedal skin intact. No open wounds. No interdigital macerations. Toenails 1-5 b/l thick, discolored, elongated with subungual debris and pain on dorsal palpation.  No hyperkeratotic nor porokeratotic lesions present on today's visit.  Musculoskeletal Examination: Muscle strength 5/5 to b/l LE. Normal muscle strength 5/5 to all lower extremity muscle groups bilaterally. HAV with bunion deformity noted b/l LE.Marland Kitchen No pain, crepitus or joint limitation noted with ROM b/l LE.  Patient ambulates independently without assistive aids.  Radiographs: None  Assessment/Plan: 1. Pain due to onychomycosis of toenails of both feet      -Patient's family member present. All questions/concerns addressed on today's visit. -No new findings. No new orders. -Patient to continue soft, supportive shoe gear daily. -Toenails 1-5 b/l were debrided in length and girth with sterile nail nippers and dremel without iatrogenic bleeding.  -Patient/POA to call should there be question/concern in the interim.   Return in about 10 weeks (around 06/23/2023).  Freddie Breech, DPM

## 2023-06-23 ENCOUNTER — Encounter: Payer: Self-pay | Admitting: Podiatry

## 2023-06-23 ENCOUNTER — Ambulatory Visit: Payer: Medicare Other | Admitting: Podiatry

## 2023-06-23 VITALS — BP 159/76

## 2023-06-23 DIAGNOSIS — B351 Tinea unguium: Secondary | ICD-10-CM

## 2023-06-23 DIAGNOSIS — M79675 Pain in left toe(s): Secondary | ICD-10-CM

## 2023-06-23 DIAGNOSIS — M79674 Pain in right toe(s): Secondary | ICD-10-CM

## 2023-06-23 NOTE — Progress Notes (Signed)
  Subjective:  Patient ID: Cathy Newman, female    DOB: Mar 20, 1925,  MRN: [id number]  Cathy Newman presents to clinic today for: painful thick toenails that are difficult to trim. Pain interferes with ambulation. Aggravating factors include wearing enclosed shoe gear. Pain is relieved with periodic professional debridement.  Chief Complaint  Patient presents with   Nail Problem    RFC,Referring Provider Marguarite Arbour, MD,lov:05/24       PCP is Marguarite Arbour, MD.  Allergies  Allergen Reactions   Amoxicillin Hives and Anaphylaxis   Brinzolamide Other (See Comments)   Other Other (See Comments)    MYCINS MYCINS    Review of Systems: Negative except as noted in the HPI.  Objective: No changes noted in today's physical examination. Vitals:   06/23/23 1552 06/23/23 1553  BP: (!) 173/72 (!) 159/76   Cathy Newman is a pleasant 89 y.o. female in NAD. AAO x 3.  Vascular Examination: Capillary refill time <3 seconds b/l LE. Faintly papable DP pulses b/l. Palpable PTl pulses b/l LE. Digital hair absent b/l. No pedal edema b/l. Skin temperature gradient warm to cool b/l. No varicosities b/l. No cyanosis or clubbing noted b/l LE.Marland Kitchen  Dermatological Examination: Pedal skin with normal turgor, texture and tone b/l. No open wounds. No interdigital macerations b/l. Toenails 1-5 b/l thickened, discolored, dystrophic with subungual debris. There is pain on palpation to dorsal aspect of nailplates. No corns/calluses/porokeratoses noted today.   Incurvated nailplate bilateral great toes right>left.  Nail border hypertrophy absent. There is tenderness to palpation of right great toe. Sign(s) of infection: no clinical signs of infection noted on examination today.  Neurological Examination: Protective sensation intact with 10 gram monofilament b/l LE.  Musculoskeletal Examination: Muscle strength 5/5 to all lower extremity muscle groups bilaterally. HAV with bunion deformity  noted b/l LE.  Assessment/Plan: 1. Pain due to onychomycosis of toenails of both feet   -Patient was evaluated and treated. All patient's and/or POA's questions/concerns answered on today's visit. -Continue supportive shoe gear daily. -Mycotic toenails 1-5 left foot and 2-5 right foot were debrided in length and girth with sterile nail nippers and dremel without incident. -No invasive procedure(s) performed. Offending nail border debrided and curretaged right great toe utilizing sterile nail nipper and currette. Border(s) cleansed with alcohol and triple antibiotic ointment applied. Instructed to perform epsom salt soak on Saturday when daughter comes to visit. Call office if there are any concerns. -Patient/POA to call should there be question/concern in the interim.   Return in about 9 weeks (around 08/25/2023).  Freddie Breech, DPM

## 2023-09-12 ENCOUNTER — Encounter: Payer: Self-pay | Admitting: Podiatry

## 2023-09-12 ENCOUNTER — Ambulatory Visit (INDEPENDENT_AMBULATORY_CARE_PROVIDER_SITE_OTHER): Payer: Medicare Other | Admitting: Podiatry

## 2023-09-12 DIAGNOSIS — B351 Tinea unguium: Secondary | ICD-10-CM

## 2023-09-12 DIAGNOSIS — M79674 Pain in right toe(s): Secondary | ICD-10-CM | POA: Diagnosis not present

## 2023-09-12 DIAGNOSIS — M79675 Pain in left toe(s): Secondary | ICD-10-CM | POA: Diagnosis not present

## 2023-09-18 NOTE — Progress Notes (Signed)
  Subjective:  Patient ID: Cathy Newman, female    DOB: Jul 02, 1925,  MRN: [id number]  89 y.o. female presents to clinic with  painful thick toenails that are difficult to trim. Pain interferes with ambulation. Aggravating factors include wearing enclosed shoe gear. Pain is relieved with periodic professional debridement.    New problem(s): None   PCP is Sparks, Duane Lope, MD.  Allergies  Allergen Reactions   Amoxicillin Hives and Anaphylaxis   Brinzolamide Other (See Comments)   Other Other (See Comments)    MYCINS MYCINS    Review of Systems: Negative except as noted in the HPI.   Objective:  Cathy Newman is a pleasant 89 y.o. female WD, WN in NAD.Marland Kitchen  Vascular Examination: Capillary refill time <3 seconds b/l LE. Faintly papable DP pulses b/l. Faintly palpable PT pulses b/l LE. Digital hair absent b/l. No pedal edema b/l. Skin temperature gradient warm to warm b/l. No varicosities b/l. No cyanosis or clubbing noted b/l LE.Marland Kitchen  Dermatological Examination: Pedal skin with normal turgor, texture and tone b/l. No open wounds. No interdigital macerations b/l. Toenails 1-5 b/l thickened, discolored, dystrophic with subungual debris. There is pain on palpation to dorsal aspect of nailplates. No corns/calluses/porokeratoses noted today.   Incurvated nailplate bilateral great toes right>left.  Nail border hypertrophy absent. There is tenderness to palpation of right great toe. Sign(s) of infection: no clinical signs of infection noted on examination today.  Neurological Examination: Protective sensation intact with 10 gram monofilament b/l LE.  Musculoskeletal Examination: Muscle strength 5/5 to all lower extremity muscle groups bilaterally. HAV with bunion deformity noted b/l LE.  Assessment:   1. Pain due to onychomycosis of toenails of both feet    Plan:  -Consent given for treatment as described below: -Examined patient. -Continue supportive shoe gear daily. -Mycotic  toenails 1-5 bilaterally were debrided in length and girth with sterile nail nippers and dremel without incident. -Patient/POA to call should there be question/concern in the interim.  Return in about 9 weeks (around 11/14/2023).  Cathy Newman, DPM

## 2023-11-08 ENCOUNTER — Emergency Department
Admission: EM | Admit: 2023-11-08 | Discharge: 2023-11-08 | Disposition: A | Discharge status: Home / Self Care | Payer: Medicare Other | Attending: Emergency Medicine | Admitting: Emergency Medicine

## 2023-11-08 ENCOUNTER — Emergency Department: Payer: Medicare Other

## 2023-11-08 DIAGNOSIS — W01198A Fall on same level from slipping, tripping and stumbling with subsequent striking against other object, initial encounter: Secondary | ICD-10-CM | POA: Insufficient documentation

## 2023-11-08 DIAGNOSIS — W19XXXA Unspecified fall, initial encounter: Secondary | ICD-10-CM

## 2023-11-08 DIAGNOSIS — S0101XA Laceration without foreign body of scalp, initial encounter: Secondary | ICD-10-CM | POA: Insufficient documentation

## 2023-11-08 DIAGNOSIS — I10 Essential (primary) hypertension: Secondary | ICD-10-CM | POA: Insufficient documentation

## 2023-11-08 DIAGNOSIS — S0990XA Unspecified injury of head, initial encounter: Secondary | ICD-10-CM | POA: Diagnosis present

## 2023-11-08 DIAGNOSIS — Y92014 Private driveway to single-family (private) house as the place of occurrence of the external cause: Secondary | ICD-10-CM | POA: Diagnosis not present

## 2023-11-08 LAB — CBC WITH DIFFERENTIAL/PLATELET
Abs Immature Granulocytes: 0.04 10*3/uL (ref 0.00–0.07)
Basophils Absolute: 0.1 10*3/uL (ref 0.0–0.1)
Basophils Relative: 1 %
Eosinophils Absolute: 0.1 10*3/uL (ref 0.0–0.5)
Eosinophils Relative: 1 %
HCT: 38.9 % (ref 36.0–46.0)
Hemoglobin: 12.9 g/dL (ref 12.0–15.0)
Immature Granulocytes: 0 %
Lymphocytes Relative: 15 %
Lymphs Abs: 1.7 10*3/uL (ref 0.7–4.0)
MCH: 31.7 pg (ref 26.0–34.0)
MCHC: 33.2 g/dL (ref 30.0–36.0)
MCV: 95.6 fL (ref 80.0–100.0)
Monocytes Absolute: 0.8 10*3/uL (ref 0.1–1.0)
Monocytes Relative: 7 %
Neutro Abs: 8.6 10*3/uL — ABNORMAL HIGH (ref 1.7–7.7)
Neutrophils Relative %: 76 %
Platelets: 260 10*3/uL (ref 150–400)
RBC: 4.07 MIL/uL (ref 3.87–5.11)
RDW: 12.5 % (ref 11.5–15.5)
WBC: 11.3 10*3/uL — ABNORMAL HIGH (ref 4.0–10.5)
nRBC: 0 % (ref 0.0–0.2)

## 2023-11-08 LAB — COMPREHENSIVE METABOLIC PANEL
ALT: 18 U/L (ref 0–44)
AST: 19 U/L (ref 15–41)
Albumin: 4.2 g/dL (ref 3.5–5.0)
Alkaline Phosphatase: 59 U/L (ref 38–126)
Anion gap: 11 (ref 5–15)
BUN: 22 mg/dL (ref 8–23)
CO2: 22 mmol/L (ref 22–32)
Calcium: 9 mg/dL (ref 8.9–10.3)
Chloride: 98 mmol/L (ref 98–111)
Creatinine, Ser: 0.56 mg/dL (ref 0.44–1.00)
GFR, Estimated: >60 mL/min (ref >60)
Glucose, Bld: 156 mg/dL — ABNORMAL HIGH (ref 70–99)
Potassium: 4.2 mmol/L (ref 3.5–5.1)
Sodium: 131 mmol/L — ABNORMAL LOW (ref 135–145)
Total Bilirubin: 0.7 mg/dL (ref <1.2)
Total Protein: 6.7 g/dL (ref 6.5–8.1)

## 2023-11-08 MED ORDER — TRANEXAMIC ACID FOR EPISTAXIS
500.0000 mg | Freq: Once | TOPICAL | Status: AC
  Administered 2023-11-08: 500 mg via TOPICAL
  Filled 2023-11-08 (×2): qty 10

## 2023-11-08 NOTE — Discharge Instructions (Signed)
Please follow-up with your primary care provider to have the wound on the back of the head evaluated in a couple days.  Please return for any worsening symptoms.

## 2023-11-08 NOTE — ED Provider Notes (Signed)
[facility] Provider Note    Event Date/Time   First MD Initiated Contact with Patient 11/08/23 1659     (approximate)   History   Fall   HPI Cathy Newman is a 89 y.o. female with history of HTN presenting today for fall.  Patient states she was out walking down her driveway when she slipped and fell backwards.  Hit the back of her head and did lose consciousness.  Denies pain anywhere else at this time.  Takes daily aspirin but not on any other blood thinners.  Denies any palpitations, chest pain, lightheadedness before the event.  No other recent falls.     Physical Exam   Triage Vital Signs: ED Triage Vitals  Encounter Vitals Group     BP 11/08/23 1637 (!) 158/82     Systolic BP Percentile --      Diastolic BP Percentile --      Pulse Rate 11/08/23 1637 76     Resp 11/08/23 1637 20     Temp 11/08/23 1637 (!) 97.4 F (36.3 C)     Temp Source 11/08/23 1637 Oral     SpO2 --      Weight 11/08/23 1639 110 lb (49.9 kg)     Height 11/08/23 1639 5' (1.524 m)     Head Circumference --      Peak Flow --      Pain Score 11/08/23 1638 1     Pain Loc --      Pain Education --      Exclude from Growth Chart --     Most recent vital signs: Vitals:   11/08/23 1637  BP: (!) 158/82  Pulse: 76  Resp: 20  Temp: (!) 97.4 F (36.3 C)   I have reviewed the vital signs. General:  Awake, alert, no acute distress. Head:  Normocephalic, hematoma to posterior scalp which is hemostatic at this time. EENT:  PERRL, EOMI, Oral mucosa pink and moist, Neck is supple. Cardiovascular: Regular rate, 2+ distal pulses. Respiratory:  Normal respiratory effort, symmetrical expansion, no distress.   Extremities:  Moving all four extremities through full ROM without pain.  No tenderness to C, T, or L-spine.  Nontender throughout chest wall and trunk.  No tenderness palpation to bilateral upper or lower extremities. Neuro:  Alert and oriented.  Interacting  appropriately.   Skin:  Warm, dry, no rash.   Psych: Appropriate affect.     ED Results / Procedures / Treatments   Labs (all labs ordered are listed, but only abnormal results are displayed) Labs Reviewed  COMPREHENSIVE METABOLIC PANEL - Abnormal; Notable for the following components:      Result Value   Sodium 131 (*)    Glucose, Bld 156 (*)    All other components within normal limits  CBC WITH DIFFERENTIAL/PLATELET - Abnormal; Notable for the following components:   WBC 11.3 (*)    Neutro Abs 8.6 (*)    All other components within normal limits     EKG My EKG interpretation: Rate of 77, normal sinus rhythm, normal axis, normal intervals.  No acute ST elevations or depressions   RADIOLOGY Independently interpreted CT imaging with no acute pathology.   PROCEDURES:  Critical Care performed: No  Procedures   MEDICATIONS ORDERED IN ED: Medications  tranexamic acid (CYKLOKAPRON) 1000 MG/10ML topical solution 500 mg (500 mg Topical Given 11/08/23 1803)     IMPRESSION / MDM / ASSESSMENT AND PLAN / ED COURSE  I reviewed the triage vital signs and the nursing notes.                              Differential diagnosis includes, but is not limited to, ICH, cervical spine injury, soft tissue hematoma  Patient's presentation is most consistent with acute complicated illness / injury requiring diagnostic workup.  Patient is a 89 year old female presenting today for ground-level fall with posterior head injury.  CT head and C-spine were ordered for further evaluation for any traumatic pathology.  No tenderness elsewhere.  CT head and C-spine unremarkable.  Labs reassuring consistent with patient's baseline.  Patient on the monitor with no concerning findings.  The laceration to her posterior scalp is more of an abrasion which does not appear amenable to sutures or staples at this time given the hematoma.  Bleeding was stopped with topical TXA.  She was then placed in a  compressive head wrap with bleeding stopped and safe for discharge.  Tetanus shot has already been updated within the last 5 years.  Patient tolerated p.o. and ambulated.  Daughter feels safe taking her home at this time and they were given strict follow-up precautions with their PCP.  The patient is on the cardiac monitor to evaluate for evidence of arrhythmia and/or significant heart rate changes. Clinical Course as of 11/08/23 1958  Tue Nov 08, 2023  1746 No acute traumatic pathology [DW]    Clinical Course User Index [DW] Janith Lima, MD     FINAL CLINICAL IMPRESSION(S) / ED DIAGNOSES   Final diagnoses:  Occipital scalp laceration, initial encounter  Fall, initial encounter  Injury of head, initial encounter     Rx / DC Orders   ED Discharge Orders     None        Note:  This document was prepared using Dragon voice recognition software and may include unintentional dictation errors.   Janith Lima, MD 11/08/23 Cathy Newman

## 2023-11-08 NOTE — ED Triage Notes (Signed)
Pt presents to the ED via [facility] from home. Pt was walking outside when she fell and hit her head. Pt is unsure if she lost consciousness. Pt is A&Ox3. Daughter states that she is at baseline. Pt does have a lac to her posterior scalp. Bleeding is controlled.

## 2023-11-08 NOTE — ED Triage Notes (Signed)
First nurse note: pt to ED [facility] from home for witnessed fall on sidewalk. Unsure what caused fall. Denies LOC or blood thinners. Laceration to posterior head, bleeding controlled. Reports nausea with movement after fall Hypertensive with EMS, denies hx

## 2023-11-08 NOTE — ED Notes (Signed)
Patient was able to ambulate with assist and tolerated po meds well

## 2023-11-14 ENCOUNTER — Ambulatory Visit: Payer: Medicare Other | Admitting: Podiatry

## 2023-11-21 ENCOUNTER — Ambulatory Visit (INDEPENDENT_AMBULATORY_CARE_PROVIDER_SITE_OTHER): Payer: Medicare Other | Admitting: Podiatry

## 2023-11-21 ENCOUNTER — Encounter: Payer: Self-pay | Admitting: Podiatry

## 2023-11-21 VITALS — Ht 60.0 in | Wt 110.0 lb

## 2023-11-21 DIAGNOSIS — M79675 Pain in left toe(s): Secondary | ICD-10-CM | POA: Diagnosis not present

## 2023-11-21 DIAGNOSIS — B351 Tinea unguium: Secondary | ICD-10-CM

## 2023-11-21 DIAGNOSIS — M79674 Pain in right toe(s): Secondary | ICD-10-CM

## 2023-11-26 NOTE — Progress Notes (Signed)
  Subjective:  Patient ID: Cathy Newman, female    DOB: 03-29-1925,  MRN: [id number]  89 y.o. female presents with painful elongated mycotic toenails 1-5 bilaterally which are tender when wearing enclosed shoe gear. Pain is relieved with periodic professional debridement. She is accompanied by her daughter on today's visit. Chief Complaint  Patient presents with   Nail Problem    Pt is here for RFC, not a diabetic PCP is Dr Judithann Sheen and LOV was in September.     PCP: Marguarite Arbour, MD.  Review of Systems: Negative except as noted in the HPI.   Allergies  Allergen Reactions   Amoxicillin Hives and Anaphylaxis   Brinzolamide Other (See Comments)   Other Other (See Comments)    MYCINS MYCINS    Objective:  There were no vitals filed for this visit. Constitutional Patient is a pleasant 89 y.o. female WD, WN in NAD. AAO x 3.  Vascular Capillary fill time to digits <3 seconds.  DP/PT pulse(s) are faintly palpable b/l lower extremities. Pedal hair absent b/l. Lower extremity skin temperature gradient warm to cool b/l. No pain with calf compression b/l. No cyanosis or clubbing noted. No ischemia nor gangrene noted b/l.   Neurologic Protective sensation intact 5/5 intact bilaterally with 10g monofilament b/l. Vibratory sensation intact b/l. No clonus b/l.   Dermatologic Pedal skin is thin, shiny and atrophic b/l.  No open wounds b/l lower extremities. No interdigital macerations b/l lower extremities. Toenails 2-5 b/l elongated, discolored, dystrophic, thickened, crumbly with subungual debris and tenderness to dorsal palpation. Pincer nail deformity bilateral great toes. No erythema, no edema, no drainage, no fluctuance. Nail border hypertrophy absent.  Sign(s) of infection: no clinical signs of infection noted on examination today..  Orthopedic: Normal muscle strength 5/5 to all lower extremity muscle groups bilaterally. HAV with bunion deformity noted b/l LE.   Last HgA1c:      No data  to display           Assessment:   1. Pain due to onychomycosis of toenails of both feet    Plan:  -Patient's family member present. All questions/concerns addressed on today's visit. -Consent given for treatment as described below: -Patient to continue soft, supportive shoe gear daily. -Toenails 1-5 b/l were debrided in length and girth with sterile nail nippers without iatrogenic bleeding.  -Patient/POA to call should there be question/concern in the interim.  Return in about 9 weeks (around 01/23/2024).  Freddie Breech, DPM      [place] LOCATION: [address] [place] [address]                   Office [phone]   [facility] LOCATION: [address] [place], [place] [address] Office [phone]

## 2024-01-23 ENCOUNTER — Ambulatory Visit (INDEPENDENT_AMBULATORY_CARE_PROVIDER_SITE_OTHER): Payer: Medicare Other | Admitting: Podiatry

## 2024-01-23 ENCOUNTER — Encounter: Payer: Self-pay | Admitting: Podiatry

## 2024-01-23 VITALS — Ht 60.0 in | Wt 110.0 lb

## 2024-01-23 DIAGNOSIS — M79674 Pain in right toe(s): Secondary | ICD-10-CM | POA: Diagnosis not present

## 2024-01-23 DIAGNOSIS — M79675 Pain in left toe(s): Secondary | ICD-10-CM

## 2024-01-23 DIAGNOSIS — B351 Tinea unguium: Secondary | ICD-10-CM

## 2024-01-29 NOTE — Progress Notes (Signed)
  Subjective:  Patient ID: Cathy Newman, female    DOB: Aug 25, 1925,  MRN: [id number]  Cathy Newman presents to clinic today for: corn(s) right foot and painful mycotic toenails that are difficult to trim. Painful toenails interfere with ambulation. Aggravating factors include wearing enclosed shoe gear. Pain is relieved with periodic professional debridement. Painful corns are aggravated when weightbearing when wearing enclosed shoe gear. Pain is relieved with periodic professional debridement.  Chief Complaint  Patient presents with   Nail Problem    Pt is here for [facility] PCP is Dr Judithann Sheen and LOV was in January.    PCP is Sparks, Duane Lope, MD.  Allergies  Allergen Reactions   Amoxicillin Hives and Anaphylaxis   Brinzolamide Other (See Comments)   Other Other (See Comments)    MYCINS MYCINS    Review of Systems: Negative except as noted in the HPI.  Objective: No changes noted in today's physical examination. There were no vitals filed for this visit.  Cathy Newman is a pleasant 89 y.o. female WD, WN in NAD. AAO x 3.  Vascular Examination: Capillary fill time to digits <3 seconds.  DP/PT pulse(s) are faintly palpable b/l lower extremities. Pedal hair absent b/l. Lower extremity skin temperature gradient warm to cool b/l. No pain with calf compression    Dermatological Examination: Pedal skin is thin, shiny and atrophic b/l.  No open wounds b/l lower extremities. No interdigital macerations b/l lower extremities. Toenails 2-5 b/l elongated, discolored, dystrophic, thickened, crumbly with subungual debris and tenderness to dorsal palpation. Pincer nail deformity bilateral great toes. No erythema, no edema, no drainage, no fluctuance. Nail border hypertrophy absent.  Sign(s) of infection: no clinical signs of infection noted on examination today. Hyperkeratotic lesion(s) distal tip of right 3rd toe.  No erythema, no edema, no drainage, no fluctuance..  Neurological  Examination: Protective sensation intact with 10 gram monofilament b/l LE. Vibratory sensation intact b/l LE.   Musculoskeletal Examination: Normal muscle strength 5/5 to all lower extremity muscle groups bilaterally. HAV with bunion deformity noted b/l LE.      No data to display         Assessment/Plan: 1. Pain due to onychomycosis of toenails of both feet    -Patient's family member present. All questions/concerns addressed on today's visit. -Mycotic toenails 1-5 bilaterally were debrided in length and girth with sterile nail nippers and dremel. Pinpoint bleeding of R 3rd toe addressed with Lumicain Hemostatic Solution, cleansed with alcohol. Band-aid applied. No further treatment required by patient/caregiver. Band-aid may be removed on tomorrow. -As a courtesy, corn(s) R 3rd toe pared utilizing sterile scalpel blade without complication or incident. Total number pared=1. -Patient/POA to call should there be question/concern in the interim.   Return in about 9 weeks (around 03/26/2024).  Freddie Breech, DPM      [place] LOCATION: [address] [place] [address]                   Office [phone]   [facility] LOCATION: [address] [place], [place] [address] Office [phone]

## 2024-03-29 ENCOUNTER — Ambulatory Visit (INDEPENDENT_AMBULATORY_CARE_PROVIDER_SITE_OTHER): Payer: Medicare Other | Admitting: Podiatry

## 2024-03-29 ENCOUNTER — Encounter: Payer: Self-pay | Admitting: Podiatry

## 2024-03-29 DIAGNOSIS — M79675 Pain in left toe(s): Secondary | ICD-10-CM

## 2024-03-29 DIAGNOSIS — B351 Tinea unguium: Secondary | ICD-10-CM

## 2024-03-29 DIAGNOSIS — M79674 Pain in right toe(s): Secondary | ICD-10-CM

## 2024-04-03 NOTE — Progress Notes (Signed)
  Subjective:  Patient ID: Cathy Newman, female    DOB: 03-12-1925,  MRN: [id number]  89 y.o. female presents with corn(s) R 3rd toe and painful mycotic toenails that are difficult to trim. Painful toenails interfere with ambulation. Aggravating factors include wearing enclosed shoe gear. Pain is relieved with periodic professional debridement. Painful corns are aggravated when weightbearing when wearing enclosed shoe gear. Pain is relieved with periodic professional debridement. Chief Complaint  Patient presents with   Nail Problem    "Trim my toenails." (Check underneath 3rd toe, she had a bandaid on it, may have a fissure.)    PCP: Yehuda Helms, MD.  Review of Systems: Negative except as noted in the HPI.   Allergies  Allergen Reactions   Amoxicillin Hives and Anaphylaxis   Brinzolamide Other (See Comments)   Other Other (See Comments)    MYCINS MYCINS    Objective:  There were no vitals filed for this visit. Constitutional Patient is a pleasant 89 y.o. female WD, WN in NAD. AAO x 3.  Vascular Capillary fill time to digits <3 seconds.  DP/PT pulse(s) are faintly palpable b/l lower extremities. Pedal hair absent b/l. Lower extremity skin temperature gradient warm to cool b/l. No pain with calf compression b/l. No cyanosis or clubbing noted. No ischemia nor gangrene noted b/l.   Neurologic Protective sensation intact 5/5 intact bilaterally with 10g monofilament b/l. Vibratory sensation intact b/l. No clonus b/l.   Dermatologic Band-aid on right 3rd toe old and soiled. No underlying wound right 3rd toe. Pedal skin is thin, shiny and atrophic b/l.  No open wounds b/l lower extremities. No interdigital macerations b/l lower extremities. Toenails 1-5 b/l elongated, discolored, dystrophic, thickened, crumbly with subungual debris and tenderness to dorsal palpation.  Resolved lesion distal tip right 3rd toe.  Orthopedic: Normal muscle strength 5/5 to all lower extremity muscle groups  bilaterally. HAV with bunion deformity noted b/l LE.   Last HgA1c:      No data to display           Assessment:   1. Pain due to onychomycosis of toenails of both feet    Plan:  Patient was evaluated and treated. All patient's and/or POA's questions/concerns addressed on today's visit. Toenails 1-5 debrided in length and girth without incident. Continue soft, supportive shoe gear daily. Report any pedal injuries to medical professional. Call office if there are any questions/concerns. -Patient/POA to call should there be question/concern in the interim.  Return in about 29 weeks (around 10/18/2024).  Luella Sager, DPM      [place] LOCATION: [address] [place] [address]                   Office [phone]   [facility] LOCATION: [address] [place], [place] [address] Office [phone]

## 2024-06-03 DIAGNOSIS — M1712 Unilateral primary osteoarthritis, left knee: Secondary | ICD-10-CM | POA: Insufficient documentation

## 2024-06-04 ENCOUNTER — Ambulatory Visit (INDEPENDENT_AMBULATORY_CARE_PROVIDER_SITE_OTHER): Admitting: Podiatry

## 2024-06-04 ENCOUNTER — Encounter: Payer: Self-pay | Admitting: Podiatry

## 2024-06-04 DIAGNOSIS — M79674 Pain in right toe(s): Secondary | ICD-10-CM | POA: Diagnosis not present

## 2024-06-04 DIAGNOSIS — B351 Tinea unguium: Secondary | ICD-10-CM

## 2024-06-04 DIAGNOSIS — M79675 Pain in left toe(s): Secondary | ICD-10-CM

## 2024-06-04 NOTE — Progress Notes (Signed)
  Subjective:  Patient ID: Cathy Newman, female    DOB: 01-Jun-1925,  MRN: [id number]  89 y.o. female presents painful elongated mycotic toenails 1-5 bilaterally which are tender when wearing enclosed shoe gear. Pain is relieved with periodic professional debridement. She is accompanied by her daughter on today's visit. Cathy Newman has been working on soaking her feet and applying moisturizer. Chief Complaint  Patient presents with   RFC    Rm4 RFC not diabetic/ Dr. Auston Last visit Jan.8 2025   New problem(s): None   PCP is Sparks, Reyes BIRCH, MD.   Allergies  Allergen Reactions   Amoxicillin Hives and Anaphylaxis   Brinzolamide Other (See Comments)   Other Other (See Comments)    MYCINS MYCINS   Review of Systems: Negative except as noted in the HPI.   Objective:  Cathy Newman is a pleasant 89 y.o. female WD, WN in NAD. AAO x 3.  Vascular Examination: Vascular status intact b/l with palpable pedal pulses. CFT immediate b/l. Pedal hair sparse b/l.  No edema. No pain with calf compression b/l. Skin temperature gradient WNL b/l. No varicosities noted. No cyanosis or clubbing noted.  Neurological Examination: Sensation grossly intact b/l with 10 gram monofilament. Vibratory sensation intact b/l.  Dermatological Examination: Pedal skin with normal turgor, texture and tone b/l. No open wounds nor interdigital macerations noted. Toenails 1-5 b/l thick, discolored, elongated with subungual debris and pain on dorsal palpation. No hyperkeratotic lesions noted b/l.   Musculoskeletal Examination: Muscle strength 5/5 to b/l LE.  No pain, crepitus noted b/l. HAV with bunion deformity noted b/l LE.  Radiographs: None  Last A1c:       No data to display           Assessment:   1. Pain due to onychomycosis of toenails of both feet    Plan:  Consent given for treatment. Patient examined. All patient's and/or POA's questions/concerns addressed on today's visit. Mycotic  toenails 1-5 debrided in length and girth without incident. Continue soft, supportive shoe gear daily. Report any pedal injuries to medical professional. Call office if there are any quesitons/concerns. -Patient/POA to call should there be question/concern in the interim.  Return in about 9 weeks (around 08/06/2024).  Cathy Newman, DPM      [place] LOCATION: [address], [place] [address]                   Office [phone]   [facility] LOCATION: [address] [place], [place] [address] Office [phone]

## 2024-08-13 ENCOUNTER — Ambulatory Visit (INDEPENDENT_AMBULATORY_CARE_PROVIDER_SITE_OTHER): Admitting: Podiatry

## 2024-08-13 DIAGNOSIS — M79674 Pain in right toe(s): Secondary | ICD-10-CM

## 2024-08-13 DIAGNOSIS — M79675 Pain in left toe(s): Secondary | ICD-10-CM | POA: Diagnosis not present

## 2024-08-13 DIAGNOSIS — B351 Tinea unguium: Secondary | ICD-10-CM

## 2024-08-17 ENCOUNTER — Encounter: Payer: Self-pay | Admitting: Podiatry

## 2024-08-17 NOTE — Progress Notes (Signed)
  Subjective:  Patient ID: Cathy Newman, female    DOB: [birth year],  MRN: [id number]  Cathy Newman presents to clinic today for painful thick toenails that are difficult to trim. Pain interferes with ambulation. Aggravating factors include wearing enclosed shoe gear. Pain is relieved with periodic professional debridement. Her daughter, Cathy Newman, is present during today's visit. She informs Cathy Newman will be moving to [place]. Chief Complaint  Patient presents with   Nail Problem    RFC   New problem(s): None.   PCP is Cathy Reyes BIRCH, Cathy Newman. Cathy Newman 07/20/2024.  Allergies  Allergen Reactions   Amoxicillin Hives and Anaphylaxis   Brinzolamide Other (See Comments)   Other Other (See Comments)    MYCINS MYCINS    Review of Systems: Negative except as noted in the HPI.  Objective: No changes noted in today's physical examination. There were no vitals filed for this visit. Cathy Newman is a pleasant 89 y.o. female WD, WN in NAD. AAO x 3.  Vascular Examination: Vascular status intact b/l with palpable pedal pulses. CFT immediate b/l. Pedal hair sparse b/l.  No edema. No pain with calf compression b/l. Skin temperature gradient WNL b/l. No varicosities noted. No cyanosis or clubbing noted.  Neurological Examination: Sensation grossly intact b/l with 10 gram monofilament. Vibratory sensation intact b/l.  Dermatological Examination: Pedal skin with normal turgor, texture and tone b/l. No open wounds nor interdigital macerations noted. Toenails 1-5 b/l thick, discolored, elongated with subungual debris and pain on dorsal palpation. No hyperkeratotic lesions noted b/l.   Musculoskeletal Examination: Muscle strength 5/5 to b/l LE.  No pain, crepitus noted b/l. HAV with bunion deformity noted b/l LE.  Radiographs: None  Assessment/Plan: 1. Pain due to onychomycosis of toenails of both feet    Patient was evaluated and treated. All patient's and/or POA's  questions/concerns addressed on today's visit. Toenails 1-5 debrided in length and girth without incident. Continue soft, supportive shoe gear daily. Report any pedal injuries to medical professional. Call office if there are any questions/concerns.  Return in about 9 weeks (around 10/15/2024).  Cathy Newman, DPM      [place] LOCATION: [address] [place] [address]                   Office [phone]   [facility] LOCATION: [address] [place], [place] [address] Office [phone]

## 2024-10-09 ENCOUNTER — Other Ambulatory Visit: Payer: Self-pay

## 2024-10-09 ENCOUNTER — Emergency Department
Admission: EM | Admit: 2024-10-09 | Discharge: 2024-10-09 | Disposition: A | Discharge status: Home / Self Care | Attending: Emergency Medicine | Admitting: Emergency Medicine

## 2024-10-09 ENCOUNTER — Emergency Department

## 2024-10-09 DIAGNOSIS — I10 Essential (primary) hypertension: Secondary | ICD-10-CM | POA: Diagnosis not present

## 2024-10-09 DIAGNOSIS — S0101XA Laceration without foreign body of scalp, initial encounter: Secondary | ICD-10-CM | POA: Insufficient documentation

## 2024-10-09 DIAGNOSIS — W1839XA Other fall on same level, initial encounter: Secondary | ICD-10-CM | POA: Diagnosis not present

## 2024-10-09 DIAGNOSIS — W19XXXA Unspecified fall, initial encounter: Secondary | ICD-10-CM

## 2024-10-09 DIAGNOSIS — Y9301 Activity, walking, marching and hiking: Secondary | ICD-10-CM | POA: Insufficient documentation

## 2024-10-09 DIAGNOSIS — S0990XA Unspecified injury of head, initial encounter: Secondary | ICD-10-CM | POA: Diagnosis present

## 2024-10-09 DIAGNOSIS — Y92014 Private driveway to single-family (private) house as the place of occurrence of the external cause: Secondary | ICD-10-CM | POA: Diagnosis not present

## 2024-10-09 MED ORDER — LIDOCAINE-EPINEPHRINE 2 %-1:100000 IJ SOLN
20.0000 mL | Freq: Once | INTRAMUSCULAR | Status: AC
  Administered 2024-10-09: 20 mL
  Filled 2024-10-09: qty 1

## 2024-10-09 MED ORDER — ACETAMINOPHEN 500 MG PO TABS
1000.0000 mg | ORAL_TABLET | Freq: Once | ORAL | Status: AC
  Administered 2024-10-09: 1000 mg via ORAL
  Filled 2024-10-09: qty 2

## 2024-10-09 NOTE — ED Provider Notes (Signed)
 [facility] Provider Note    Event Date/Time   First MD Initiated Contact with Patient 10/09/24 1545     (approximate)   History   Fall    HPI  Cathy Newman is a 89 y.o. female    with a past medical history of hypertension, onychomycosis, right leg pain, with no significant past medical history who presents to the ED complaining of fall. According to the patient's daughter, patient lives by herself and she fell today in the driveway.  No witnesses.  Patient endorses no loss of consciousness.  Daughter states patient has dementia that is progressing.  Daughter states patient has hyponatremia and her normal levels are 130.  Family is looking for a place for the patient to move and not to live by herself.    Patient Active Problem List   Diagnosis Date Noted   Pain due to onychomycosis of toenails of both feet 05/28/2019   Hyponatremia 02/17/2019   Acute hyponatremia 02/08/2019   HTN, goal below 140/80 02/06/2019   Atrophic vaginitis 02/10/2015   Glaucoma 02/10/2015   HLD (hyperlipidemia) 02/10/2015   Hypoglycemia 02/10/2015   Malignant melanoma of knee (HCC) 02/10/2015   Acne erythematosa 02/10/2015   Thyroid  nodule 02/10/2015   Below normal amount of sodium in the blood 02/10/2015   Abnormal blood sugar 10/28/2014   Abnormal glucose level 10/28/2014   Acute infection of nasal sinus 07/19/2014   Female genuine stress incontinence 05/01/2014   Prolapse of urethra 05/01/2014   Urge incontinence of urine 05/01/2014      Physical Exam   Triage Vital Signs: ED Triage Vitals  Encounter Vitals Group     BP 10/09/24 1426 (!) 144/63     Girls Systolic BP Percentile --      Girls Diastolic BP Percentile --      Boys Systolic BP Percentile --      Boys Diastolic BP Percentile --      Pulse Rate 10/09/24 1426 71     Resp 10/09/24 1426 20     Temp 10/09/24 1426 98 F (36.7 C)     Temp Source 10/09/24 1426 Oral     SpO2 10/09/24 1426 97 %      Weight 10/09/24 1427 110 lb (49.9 kg)     Height 10/09/24 1427 5' 1 (1.549 m)     Head Circumference --      Peak Flow --      Pain Score 10/09/24 1425 4     Pain Loc --      Pain Education --      Exclude from Growth Chart --     Most recent vital signs: Vitals:   10/09/24 1426  BP: (!) 144/63  Pulse: 71  Resp: 20  Temp: 98 F (36.7 C)  SpO2: 97%     Physical Exam Vitals and nursing note reviewed.   General:          Awake, no distress.  Head: Presence of lump at the coronal area occipital superior right side of the head.  No active bleeding CV:                  Good peripheral perfusion.  Resp:               Normal effort. no tachypnea Abd:                 No distention.  Soft nontender Other:  Knees presence of abrasion of the skin, no active bleeding. ED Results / Procedures / Treatments   Labs (all labs ordered are listed, but only abnormal results are displayed) Labs Reviewed - No data to display     RADIOLOGY I independently reviewed and interpreted imaging and agree with radiologists findings.     PROCEDURES:  Critical Care performed:   .Laceration Repair  Date/Time: 10/09/2024 10:49 PM  Performed by: Janit Kast, PA-C Authorized by: Janit Kast, PA-C   Consent:    Consent obtained:  Verbal   Consent given by:  Patient and guardian   Risks discussed:  Pain Universal protocol:    Procedure explained and questions answered to patient or proxy's satisfaction: yes     Patient identity confirmed:  Verbally with patient Laceration details:    Location:  Scalp   Length (cm):  4   Depth (mm):  2 Exploration:    Limited defect created (wound extended): no     Hemostasis achieved with:  Direct pressure   Imaging outcome: foreign body not noted     Contaminated: no   Treatment:    Area cleansed with:  Povidone-iodine   Amount of cleaning:  Standard   Irrigation solution:  Sterile saline   Irrigation volume:  200    Irrigation method:  Tap   Visualized foreign bodies/material removed: no     Debridement:  None   Undermining:  None Skin repair:    Repair method: Gauzze  compression. Repair type:    Repair type:  Intermediate Comments:     I was unable to stop the bleeding, consulted with Dr. Suzanne who injected lidocaine  with epinephrine , applied pressure and dressing.    MEDICATIONS ORDERED IN ED: Medications  lidocaine -EPINEPHrine  (XYLOCAINE  W/EPI) 2 %-1:100000 (with pres) injection 20 mL (20 mLs Other Given by Other 10/09/24 1732)  acetaminophen  (TYLENOL ) tablet 1,000 mg (1,000 mg Oral Given 10/09/24 1924)   Clinical Course as of 10/09/24 2251  Tue Oct 09, 2024  1644 CT Head Wo Contrast  No acute brain injury. 2. Moderate scalp contusion over the high right posterior occipital scalp. 3. No acute cervical spine injury. 4. Moderate spondylosis throughout the cervical spine with disc disease at the C4-5, C5-6 and C6-7 levels as well as multilevel neural foraminal narrowing as described.   [AE]    Clinical Course User Index [AE] Janit Kast, PA-C    IMPRESSION / MDM / ASSESSMENT AND PLAN / ED COURSE  I reviewed the triage vital signs and the nursing notes.  Differential diagnosis includes, but is not limited to, head trauma, head laceration, intracranial hemorrhage, fracture.  Patient's presentation is most consistent with acute complicated illness / injury requiring diagnostic workup.   Cathy Newman is a 89 y.o., female who was brought today after having a mechanical fall.  Patient denies dizziness.  Patient presents with protrusion in the right superior occipital area.  No active bleeding.  Lower extremities presence of radiation of the skin on her knees.  No active bleeding.  Rest of physical exam is within normal limits, no other signs of trauma.  No tenderness to palpation. Patient's diagnosis is consistent with fall, head trauma. I independently reviewed and interpreted  imaging and agree with radiologists findings: No intracranial hemorrhage, fracture. . I did review the patient's allergies and medications.The patient is in stable and satisfactory condition for discharge home  Patient will be discharged home without prescriptions.. Patient is to follow up with PCP to follow-up her hyponatremia as  needed or otherwise directed. Patient is given ED precautions to return to the ED for any worsening or new symptoms. Discussed plan of care with patient, answered all of patient's questions, Patient agreeable to plan of care. Advised patient to take medications according to the instructions on the label. Discussed possible side effects of new medications. Patient verbalized understanding.  FINAL CLINICAL IMPRESSION(S) / ED DIAGNOSES   Final diagnoses:  Fall, initial encounter  Minor head injury, initial encounter  Laceration of scalp, initial encounter     Rx / DC Orders   ED Discharge Orders     None        Note:  This document was prepared using Dragon voice recognition software and may include unintentional dictation errors.   Janit Kast, PA-C 10/09/24 2251    Suzanne Kirsch, MD 10/09/24 [id number]

## 2024-10-09 NOTE — Discharge Instructions (Signed)
 You have been diagnosed with fall, minor head injury.  You can take acetaminophen  every 6 hours as needed for pain.  Please continue taking your Gatorade and your sodium tablet.  Please call Dr. Auston and make an appointment for a follow-up of your sodium levels.  Please come back to ED or go to your PCP if you have new symptoms or symptoms worsen.

## 2024-10-09 NOTE — ED Triage Notes (Signed)
 Pt to ED with family members for unwitnessed mechanical fall while walking on uneven curb and moving trash cans. Pt has laceration and hematoma to top-back of head. Bldg controlled. Speech is clear, alert and oriented. No blood thinners.

## 2024-10-09 NOTE — ED Provider Notes (Addendum)
 Shared visit   Patient has a past medical history significant for dementia and had a fall today whenever she was carrying her trash cans back from the road.  No loss of consciousness.  Patient states that she feels fine and is not having any headache or any pain at this time.  CT scan of her head and cervical spine with no signs of intracranial hemorrhage and no cervical spine fracture.  Laceration and scalp wound was irrigated at bedside.  Will staple if there is any laceration to repair.  Has a history of hyponatremia however on chart review most recently sodium level has been normal at 135.  Denies any lightheadedness or dizziness at this time.  Discussed close follow-up with primary care physician for wound recheck and any lab work as needed.  Had a nonfocal neurologic exam.  Denies any dizziness, lightheadedness, nausea, vomiting, fever, chills, chest pain or shortness of breath.  Boggy hematoma with ongoing active oozing and bleeding.  Surgicel and then quick clot with hemostasis.  No obvious laceration to repair.  Pressure dressing was applied.  Given Tylenol  for pain control.  Discussed keeping the dressing in place.  Sent home with quick clot in place.  Discussed close follow-up with primary care and return precautions.  .Laceration Repair  Date/Time: 10/09/2024 7:18 PM  Performed by: Suzanne Kirsch, MD Authorized by: Suzanne Kirsch, MD   Consent:    Consent obtained:  Verbal   Consent given by:  Patient   Risks, benefits, and alternatives were discussed: yes     Risks discussed:  Pain, nerve damage, poor wound healing, poor cosmetic result, vascular damage, tendon damage, infection and need for additional repair   Alternatives discussed:  Delayed treatment and no treatment Universal protocol:    Procedure explained and questions answered to patient or proxy's satisfaction: yes     Relevant documents present and verified: yes     Test results available: yes     Imaging studies  available: yes     Required blood products, implants, devices, and special equipment available: yes     Patient identity confirmed:  Verbally with patient Anesthesia:    Anesthesia method:  Local infiltration   Local anesthetic:  Lidocaine  1% WITH epi Laceration details:    Location:  Scalp   Scalp location:  Crown   Length (cm):  3 Pre-procedure details:    Preparation:  Patient was prepped and draped in usual sterile fashion Treatment:    Amount of cleaning:  Standard Skin repair:    Repair method:  Tissue adhesive (Quick clot, Surgicel) Approximation:    Approximation:  Close Repair type:    Repair type:  Complex Post-procedure details:    Dressing:  Sterile dressing   Procedure completion:  Tolerated well, no immediate complications Comments:     Patient with a large hematoma with active bleeding.  Not hemostatic.  No laceration but does have a boggy hematoma with ongoing oozing.  No obvious arterial bleed.  Placed hemostat and became hemostatic but then rebled once removed.  Then placed quick clot with pressure dressing.  Monitored for 30 minutes with no ongoing bleeding.     Suzanne Kirsch, MD 10/09/24 1701    Suzanne Kirsch, MD 10/09/24 1919

## 2024-10-15 ENCOUNTER — Ambulatory Visit (INDEPENDENT_AMBULATORY_CARE_PROVIDER_SITE_OTHER): Admitting: Podiatry

## 2024-10-15 ENCOUNTER — Encounter: Payer: Self-pay | Admitting: Podiatry

## 2024-10-15 DIAGNOSIS — M79675 Pain in left toe(s): Secondary | ICD-10-CM

## 2024-10-15 DIAGNOSIS — M79674 Pain in right toe(s): Secondary | ICD-10-CM

## 2024-10-15 DIAGNOSIS — B351 Tinea unguium: Secondary | ICD-10-CM

## 2024-10-15 NOTE — Progress Notes (Signed)
  Subjective:  Patient ID: Cathy Newman, female    DOB: 11-18-1925,  MRN: [id number]  Cathy Newman presents to clinic today for painful mycotic toenails x 10 which interfere with daily activities. Pain is relieved with periodic professional debridement. Patient is accompanied by her daughter, Cathy Newman, on today's visit. Patient a fall at home on 10/09/24. She sustained a scalp hematoma. She was treated at [facility] ED. She is now back home. Chief Complaint  Patient presents with   Toe Pain    A1c  6.8  Dr. Auston is her PCP. Last visit was last month for a fall    New problem(s): None.   PCP is Sparks, Reyes BIRCH, MD.  Allergies  Allergen Reactions   Amoxicillin Hives and Anaphylaxis   Brinzolamide Other (See Comments)   Other Other (See Comments)    MYCINS MYCINS    Review of Systems: Negative except as noted in the HPI.  Objective: No changes noted in today's physical examination. There were no vitals filed for this visit. Cathy Newman is a pleasant 89 y.o. female WD, WN in NAD. AAO x 3.  Vascular Examination: Vascular status intact b/l with palpable pedal pulses. CFT immediate b/l. Pedal hair sparse b/l.  No edema. No pain with calf compression b/l. Skin temperature gradient WNL b/l. No varicosities noted. No cyanosis or clubbing noted.  Neurological Examination: Sensation grossly intact b/l with 10 gram monofilament. Vibratory sensation intact b/l.  Dermatological Examination: Pedal skin with normal turgor, texture and tone b/l. No open wounds nor interdigital macerations noted. Toenails 1-5 b/l thick, discolored, elongated with subungual debris and pain on dorsal palpation. Minimal hyperkeratos(is/es) noted distal tip of right 3rd toe.  Musculoskeletal Examination: Muscle strength 5/5 to b/l LE.  No pain, crepitus noted b/l. HAV with bunion deformity noted b/l LE.  Radiographs: None  Assessment/Plan: 1. Pain due to onychomycosis of toenails of both feet   -Patient  was evaluated today. All questions/concerns addressed on today's visit. -Patient's family member present. All questions/concerns addressed on today's visit. -Patient to continue soft, supportive shoe gear daily. -Toenails 1-5 b/l were debrided in length and girth with sterile nail nippers and dremel without iatrogenic bleeding.  -As a courtesy, corn(s) distal tip of right 3rd toe pared utilizing sterile scalpel blade. Light bleeding addressed with Lumicain Hemostatic Solution. Triple antibiotic ointment and band-aid applied. . -Patient/POA to call should there be question/concern in the interim.   Return in about 9 weeks (around 12/17/2024).  Cathy Newman, DPM      [place] LOCATION: [address], [place] [address]                   Office [phone]   [facility] LOCATION: [address] [place], [place] [address] Office [phone]

## 2024-11-06 ENCOUNTER — Other Ambulatory Visit: Payer: Self-pay

## 2024-11-06 ENCOUNTER — Encounter: Payer: Self-pay | Admitting: Emergency Medicine

## 2024-11-06 ENCOUNTER — Emergency Department
Admission: EM | Admit: 2024-11-06 | Discharge: 2024-11-06 | Disposition: A | Discharge status: Home / Self Care | Attending: Emergency Medicine | Admitting: Emergency Medicine

## 2024-11-06 ENCOUNTER — Emergency Department

## 2024-11-06 DIAGNOSIS — W01198A Fall on same level from slipping, tripping and stumbling with subsequent striking against other object, initial encounter: Secondary | ICD-10-CM | POA: Insufficient documentation

## 2024-11-06 DIAGNOSIS — S40012A Contusion of left shoulder, initial encounter: Secondary | ICD-10-CM | POA: Diagnosis not present

## 2024-11-06 DIAGNOSIS — S0083XA Contusion of other part of head, initial encounter: Secondary | ICD-10-CM | POA: Insufficient documentation

## 2024-11-06 DIAGNOSIS — F0390 Unspecified dementia without behavioral disturbance: Secondary | ICD-10-CM | POA: Diagnosis not present

## 2024-11-06 DIAGNOSIS — S0990XA Unspecified injury of head, initial encounter: Secondary | ICD-10-CM

## 2024-11-06 DIAGNOSIS — Y92002 Bathroom of unspecified non-institutional (private) residence single-family (private) house as the place of occurrence of the external cause: Secondary | ICD-10-CM | POA: Diagnosis not present

## 2024-11-06 DIAGNOSIS — E871 Hypo-osmolality and hyponatremia: Secondary | ICD-10-CM | POA: Insufficient documentation

## 2024-11-06 DIAGNOSIS — R739 Hyperglycemia, unspecified: Secondary | ICD-10-CM | POA: Insufficient documentation

## 2024-11-06 DIAGNOSIS — W19XXXA Unspecified fall, initial encounter: Secondary | ICD-10-CM

## 2024-11-06 LAB — BASIC METABOLIC PANEL WITH GFR
Anion gap: 14 (ref 5–15)
BUN: 17 mg/dL (ref 8–23)
CO2: 18 mmol/L — ABNORMAL LOW (ref 22–32)
Calcium: 9.2 mg/dL (ref 8.9–10.3)
Chloride: 96 mmol/L — ABNORMAL LOW (ref 98–111)
Creatinine, Ser: 0.6 mg/dL (ref 0.44–1.00)
GFR, Estimated: >60 mL/min (ref >60)
Glucose, Bld: 252 mg/dL — ABNORMAL HIGH (ref 70–99)
Potassium: 4.1 mmol/L (ref 3.5–5.1)
Sodium: 127 mmol/L — ABNORMAL LOW (ref 135–145)

## 2024-11-06 LAB — URINALYSIS, ROUTINE W REFLEX MICROSCOPIC
Bacteria, UA: NONE SEEN
Bilirubin Urine: NEGATIVE
Glucose, UA: >=500 mg/dL — AB
Hgb urine dipstick: NEGATIVE
Ketones, ur: 5 mg/dL — AB
Leukocytes,Ua: NEGATIVE
Nitrite: NEGATIVE
Protein, ur: NEGATIVE mg/dL
Specific Gravity, Urine: 1.006 (ref 1.005–1.030)
pH: 8 (ref 5.0–8.0)

## 2024-11-06 LAB — CBC WITH DIFFERENTIAL/PLATELET
Abs Immature Granulocytes: 0.05 K/uL (ref 0.00–0.07)
Basophils Absolute: 0.1 K/uL (ref 0.0–0.1)
Basophils Relative: 1 %
Eosinophils Absolute: 0 K/uL (ref 0.0–0.5)
Eosinophils Relative: 0 %
HCT: 40 % (ref 36.0–46.0)
Hemoglobin: 13.2 g/dL (ref 12.0–15.0)
Immature Granulocytes: 0 %
Lymphocytes Relative: 9 %
Lymphs Abs: 1 K/uL (ref 0.7–4.0)
MCH: 31.4 pg (ref 26.0–34.0)
MCHC: 33 g/dL (ref 30.0–36.0)
MCV: 95 fL (ref 80.0–100.0)
Monocytes Absolute: 0.5 K/uL (ref 0.1–1.0)
Monocytes Relative: 5 %
Neutro Abs: 9.8 K/uL — ABNORMAL HIGH (ref 1.7–7.7)
Neutrophils Relative %: 85 %
Platelets: 322 K/uL (ref 150–400)
RBC: 4.21 MIL/uL (ref 3.87–5.11)
RDW: 11.9 % (ref 11.5–15.5)
WBC: 11.5 K/uL — ABNORMAL HIGH (ref 4.0–10.5)
nRBC: 0 % (ref 0.0–0.2)

## 2024-11-06 LAB — BLOOD GAS, VENOUS

## 2024-11-06 LAB — BETA-HYDROXYBUTYRIC ACID: Beta-Hydroxybutyric Acid: 0.29 mmol/L — ABNORMAL HIGH (ref 0.05–0.27)

## 2024-11-06 MED ORDER — SODIUM CHLORIDE 0.9 % IV BOLUS
500.0000 mL | Freq: Once | INTRAVENOUS | Status: AC
  Administered 2024-11-06: 500 mL via INTRAVENOUS

## 2024-11-06 NOTE — ED Triage Notes (Signed)
 Pt's caregiver found pt on the couch this morning. Pt said she fell in the bathroom. Pt is not on blood thinners. Pt c/o dizziness and n/v. Pt has scab on back of head from fall on November 4. Pt has bruising to forehead and laceration to L ear. Pt does not recall the fall. Pt has new onset dementia.

## 2024-11-06 NOTE — ED Notes (Signed)
 This EDT assisted this pt to BR. Pt was ambulatory with moderate assistance. This EDT provided peri care and pts own  fresh briefs. Pt is now in bed resting, family @ bedside with no other requests.

## 2024-11-06 NOTE — ED Provider Notes (Signed)
 [facility] Provider Note    Event Date/Time   First MD Initiated Contact with Patient 11/06/24 1102     (approximate)   History   Fall   HPI  Cathy Newman is a 89 y.o. female history of dementia, glaucoma, enlarged heart presents emergency department after a fall.  Daughter states that she went to the bathroom and fell hitting her head.  Daughter states her gait was off.  Complained of dizziness.  States does have a history of hyponatremia and they usually give her a salt tablet along with Gatorade.  States her glucose has been rising recently.  Denies chest pain, shortness of breath      Physical Exam   Triage Vital Signs: ED Triage Vitals  Encounter Vitals Group     BP 11/06/24 1026 (!) 193/77     Girls Systolic BP Percentile --      Girls Diastolic BP Percentile --      Boys Systolic BP Percentile --      Boys Diastolic BP Percentile --      Pulse Rate 11/06/24 1026 65     Resp 11/06/24 1026 16     Temp 11/06/24 1026 97.7 F (36.5 C)     Temp Source 11/06/24 1026 Oral     SpO2 11/06/24 1026 98 %     Weight 11/06/24 1028 108 lb (49 kg)     Height 11/06/24 1028 5' 1 (1.549 m)     Head Circumference --      Peak Flow --      Pain Score 11/06/24 1027 0     Pain Loc --      Pain Education --      Exclude from Growth Chart --     Most recent vital signs: Vitals:   11/06/24 1026  BP: (!) 193/77  Pulse: 65  Resp: 16  Temp: 97.7 F (36.5 C)  SpO2: 98%     General: Awake, no distress.   CV:  Good peripheral perfusion. regular rate and  rhythm Resp:  Normal effort. Lungs cta Abd:  No distention.   Other:  Bruising noted at the left shoulder, patient sleeping comfortably, bruising noted on the forehead, wound noted on the back of the skull   ED Results / Procedures / Treatments   Labs (all labs ordered are listed, but only abnormal results are displayed) Labs Reviewed  CBC WITH DIFFERENTIAL/PLATELET - Abnormal; Notable for  the following components:      Result Value   WBC 11.5 (*)    Neutro Abs 9.8 (*)    All other components within normal limits  BASIC METABOLIC PANEL WITH GFR - Abnormal; Notable for the following components:   Sodium 127 (*)    Chloride 96 (*)    CO2 18 (*)    Glucose, Bld 252 (*)    All other components within normal limits  BETA-HYDROXYBUTYRIC ACID - Abnormal; Notable for the following components:   Beta-Hydroxybutyric Acid 0.29 (*)    All other components within normal limits  URINALYSIS, ROUTINE W REFLEX MICROSCOPIC - Abnormal; Notable for the following components:   Color, Urine STRAW (*)    APPearance CLEAR (*)    Glucose, UA >=500 (*)    Ketones, ur 5 (*)    All other components within normal limits  BLOOD GAS, VENOUS     EKG     RADIOLOGY CT head, C-spine    PROCEDURES:   Procedures  Critical Care:  Chief Complaint  Patient presents with   Fall      MEDICATIONS ORDERED IN ED: Medications  sodium chloride  0.9 % bolus 500 mL (0 mLs Intravenous Stopped 11/06/24 1406)     IMPRESSION / MDM / ASSESSMENT AND PLAN / ED COURSE  I reviewed the triage vital signs and the nursing notes.                              Differential diagnosis includes, but is not limited to, CVA, subdural, SAH, fracture, contusion, strain, hypoglycemia, hyperglycemia, hyponatremia  Patient's presentation is most consistent with acute presentation with potential threat to life or bodily function.   Medications given: Normal saline 500 mL  IV  CT head and cervical spine independent review interpretation by me as being negative for any acute abnormality  BP elevated, daughter states her sodium is low usually does elevate.  Labs with hyponatremia of sodium 127, CO2 decreased chloride decreased, glucose 252, WBC 11.5.  Of note the patient also had a recent UTI.  Have been placed on Keflex.  Will get UA   UA is reassuring other than the glucose of greater than 500, beta  hydroxy is 0.29 which is almost in the normal range of 0.27  I did explain everything to the patient and her daughter.  Of note the patient's family does note that she is at end-of-life.  I did recommend they call hospice to see if there could be any help for end-of-life care.  Follow-up with your regular doctor.  Return emergency department if worsening.  Did mention that they should follow-up with Dr. Auston concerning the glucose being elevated as it looks like he has been trending up.  They are in agreement this treatment plan.  She is discharged stable condition.   FINAL CLINICAL IMPRESSION(S) / ED DIAGNOSES   Final diagnoses:  Fall, initial encounter  Hyperglycemia  Minor head injury, initial encounter     Rx / DC Orders   ED Discharge Orders     None        Note:  This document was prepared using Dragon voice recognition software and may include unintentional dictation errors.    Gasper Devere ORN, PA-C 11/06/24 1407    Arlander Charleston, MD 11/06/24 [phone]

## 2024-11-07 LAB — BLOOD GAS, VENOUS
Bicarbonate: 27.8 mmol/L (ref 20.0–28.0)
O2 Saturation: 34 % (ref 0.0–2.0)
Patient temperature: 37
Patient temperature: 37 %
pCO2, Ven: 47 mmHg (ref 44–60)
pH, Ven: 7.38 (ref 7.25–7.43)
pO2, Ven: 27.8 mmHg (ref 32–45)

## 2024-11-09 ENCOUNTER — Emergency Department

## 2024-11-09 ENCOUNTER — Other Ambulatory Visit: Payer: Self-pay

## 2024-11-09 ENCOUNTER — Emergency Department
Admission: EM | Admit: 2024-11-09 | Discharge: 2024-11-09 | Disposition: A | Discharge status: Home / Self Care | Source: Ambulatory Visit | Attending: Emergency Medicine | Admitting: Emergency Medicine

## 2024-11-09 DIAGNOSIS — R42 Dizziness and giddiness: Secondary | ICD-10-CM | POA: Insufficient documentation

## 2024-11-09 DIAGNOSIS — I10 Essential (primary) hypertension: Secondary | ICD-10-CM | POA: Insufficient documentation

## 2024-11-09 DIAGNOSIS — F0390 Unspecified dementia without behavioral disturbance: Secondary | ICD-10-CM | POA: Insufficient documentation

## 2024-11-09 LAB — COMPREHENSIVE METABOLIC PANEL WITH GFR
ALT: 14 U/L (ref 0–44)
AST: 19 U/L (ref 15–41)
Albumin: 4.3 g/dL (ref 3.5–5.0)
Alkaline Phosphatase: 78 U/L (ref 38–126)
Anion gap: 12 (ref 5–15)
BUN: 14 mg/dL (ref 8–23)
CO2: 25 mmol/L (ref 22–32)
Calcium: 9.1 mg/dL (ref 8.9–10.3)
Chloride: 96 mmol/L — ABNORMAL LOW (ref 98–111)
Creatinine, Ser: 0.65 mg/dL (ref 0.44–1.00)
GFR, Estimated: >60 mL/min (ref >60)
Glucose, Bld: 226 mg/dL — ABNORMAL HIGH (ref 70–99)
Potassium: 3.9 mmol/L (ref 3.5–5.1)
Sodium: 132 mmol/L — ABNORMAL LOW (ref 135–145)
Total Bilirubin: 0.5 mg/dL (ref 0.0–1.2)
Total Protein: 6.9 g/dL (ref 6.5–8.1)

## 2024-11-09 LAB — URINALYSIS, COMPLETE (UACMP) WITH MICROSCOPIC
Bacteria, UA: NONE SEEN
Bilirubin Urine: NEGATIVE
Glucose, UA: >=500 mg/dL — AB
Hgb urine dipstick: NEGATIVE
Ketones, ur: NEGATIVE mg/dL
Leukocytes,Ua: NEGATIVE
Nitrite: NEGATIVE
Protein, ur: NEGATIVE mg/dL
Specific Gravity, Urine: 1.005 (ref 1.005–1.030)
Squamous Epithelial / HPF: 0 /HPF (ref 0–5)
pH: 7 (ref 5.0–8.0)

## 2024-11-09 LAB — CBC WITH DIFFERENTIAL/PLATELET
Abs Immature Granulocytes: 0.02 K/uL (ref 0.00–0.07)
Basophils Absolute: 0.1 K/uL (ref 0.0–0.1)
Basophils Relative: 1 %
Eosinophils Absolute: 0 K/uL (ref 0.0–0.5)
Eosinophils Relative: 1 %
HCT: 36.1 % (ref 36.0–46.0)
Hemoglobin: 12.3 g/dL (ref 12.0–15.0)
Immature Granulocytes: 0 %
Lymphocytes Relative: 19 %
Lymphs Abs: 1.5 K/uL (ref 0.7–4.0)
MCH: 31.1 pg (ref 26.0–34.0)
MCHC: 34.1 g/dL (ref 30.0–36.0)
MCV: 91.4 fL (ref 80.0–100.0)
Monocytes Absolute: 0.8 K/uL (ref 0.1–1.0)
Monocytes Relative: 10 %
Neutro Abs: 5.4 K/uL (ref 1.7–7.7)
Neutrophils Relative %: 69 %
Platelets: 307 K/uL (ref 150–400)
RBC: 3.95 MIL/uL (ref 3.87–5.11)
RDW: 11.9 % (ref 11.5–15.5)
WBC: 7.8 K/uL (ref 4.0–10.5)
nRBC: 0 % (ref 0.0–0.2)

## 2024-11-09 MED ORDER — MECLIZINE HCL 25 MG PO TABS
25.0000 mg | ORAL_TABLET | Freq: Once | ORAL | Status: AC
  Administered 2024-11-09: 25 mg via ORAL
  Filled 2024-11-09: qty 1

## 2024-11-09 MED ORDER — SODIUM CHLORIDE 0.9 % IV BOLUS
1000.0000 mL | Freq: Once | INTRAVENOUS | Status: AC
  Administered 2024-11-09: 1000 mL via INTRAVENOUS

## 2024-11-09 MED ORDER — MECLIZINE HCL 25 MG PO TABS: 25.0000 mg | ORAL_TABLET | Freq: Three times a day (TID) | ORAL | 0 refills | Status: AC | PRN

## 2024-11-09 MED ORDER — ONDANSETRON HCL 4 MG/2ML IJ SOLN
4.0000 mg | Freq: Once | INTRAMUSCULAR | Status: AC
  Administered 2024-11-09: 4 mg via INTRAVENOUS
  Filled 2024-11-09: qty 2

## 2024-11-09 NOTE — ED Triage Notes (Signed)
 Pt comes in via pov with complaints of dizziness and nausea. Pt had a fall on Tuesday and was seen in the ER. Pt's CT was negative. Pt was sent over by her primary care provider, because pt continues to have dizziness. Pt has taken all of her medications today. Pt has no complaints of pain at this time.

## 2024-11-09 NOTE — Discharge Instructions (Addendum)
 Please take your meclizine  as needed, as prescribed.  Return to the emergency department for any worsening dizziness, any weakness or numbness confusion or any other symptom concerning to yourself.

## 2024-11-09 NOTE — ED Notes (Signed)
 This tech assisted pt. to bedside commode at this time.

## 2024-11-09 NOTE — ED Provider Notes (Signed)
 [facility] Provider Note    Event Date/Time   First MD Initiated Contact with Patient 11/09/24 1035     (approximate)  History   Chief Complaint: Dizziness  HPI  Cathy Newman is a 89 y.o. female with a past medical history of dementia, hypertension, presents to the emergency department for continued dizziness.  According to family they state patient had a fall on Tuesday she was seen in the emergency department had a CT scan of the head showing no significant finding salt level was found to be slightly low however they have since followed up with her PCP this past Wednesday and had come back up to 131 per daughter.  She states however the patient has continued to complain of dizziness since the fall on Tuesday and states her complaints have gotten worse.  Daughter was concerned so she brought the patient to the emergency department for further evaluation.  Physical Exam   Triage Vital Signs: ED Triage Vitals  Encounter Vitals Group     BP 11/09/24 1019 (!) 174/70     Girls Systolic BP Percentile --      Girls Diastolic BP Percentile --      Boys Systolic BP Percentile --      Boys Diastolic BP Percentile --      Pulse Rate 11/09/24 1019 (!) 59     Resp 11/09/24 1019 18     Temp 11/09/24 1019 98 F (36.7 C)     Temp src --      SpO2 11/09/24 1019 100 %     Weight 11/09/24 1013 108 lb 0.4 oz (49 kg)     Height 11/09/24 1013 5' 1 (1.549 m)     Head Circumference --      Peak Flow --      Pain Score 11/09/24 1013 0     Pain Loc --      Pain Education --      Exclude from Growth Chart --     Most recent vital signs: Vitals:   11/09/24 1019  BP: (!) 174/70  Pulse: (!) 59  Resp: 18  Temp: 98 F (36.7 C)  SpO2: 100%    General: Awake, no distress.  Older appearing bruising to the left forehead and face. CV:  Good peripheral perfusion.  Regular rate and rhythm  Resp:  Normal effort.  Equal breath sounds bilaterally.  Abd:  No distention.   Soft, nontender.  No rebound or guarding.   ED Results / Procedures / Treatments   EKG  EKG viewed and interpreted by myself shows a sinus rhythm at 58 bpm with a narrow QRS, normal axis, normal intervals, no concerning ST changes.  RADIOLOGY  I have reviewed and interpreted the CT head images.  I do not appreciate any bleed on my evaluation of the images.   MEDICATIONS ORDERED IN ED: Medications  sodium chloride  0.9 % bolus 1,000 mL (has no administration in time range)     IMPRESSION / MDM / ASSESSMENT AND PLAN / ED COURSE  I reviewed the triage vital signs and the nursing notes.  Patient's presentation is most consistent with acute presentation with potential threat to life or bodily function.  Patient presents emergency department for dizziness.  Patient had a fall this past Tuesday and ever since has been complaining of dizziness but this has been worsening per daughter.  Here patient appears well she does complain of dizziness.  Patient has dementia and is not  clear how accurate her history is.  Daughter gives a good history.  We will check labs including CBC chemistry urine sample.  Will obtain a repeat CT scan of the head to rule out delayed bleed.  Will dose fluids Zofran  and meclizine  and reassess.  Daughter and patient agreeable.  Patient is much more calm no longer complaining of dizziness.  Workup is reassuring with a normal CBC reassuring chemistry reassuring urinalysis.  CT scan of the head is negative.  Patient seemed to respond very well to meclizine .  I had a discussion with the daughter regarding the medication and they would like to try it at home, as it seems to be effective for the patient for her dizziness.  Although typically I prefer to stay away from antihistamine type medications in elderly however as she appears to have responded very well to the medication and tolerated well I believe it would be safe for a trial at home especially as the daughter is her  primary caregiver and would be the one administering the medication and watching for side effects.  FINAL CLINICAL IMPRESSION(S) / ED DIAGNOSES   Dizziness   Note:  This document was prepared using Dragon voice recognition software and may include unintentional dictation errors.   Dorothyann Drivers, MD 11/09/24 1319

## 2024-11-20 ENCOUNTER — Encounter: Attending: Internal Medicine | Admitting: Internal Medicine

## 2024-11-20 DIAGNOSIS — S0003XD Contusion of scalp, subsequent encounter: Secondary | ICD-10-CM | POA: Insufficient documentation

## 2024-11-20 DIAGNOSIS — X58XXXD Exposure to other specified factors, subsequent encounter: Secondary | ICD-10-CM | POA: Diagnosis not present

## 2024-11-20 DIAGNOSIS — F02A3 Dementia in other diseases classified elsewhere, mild, with mood disturbance: Secondary | ICD-10-CM | POA: Diagnosis not present

## 2024-11-20 DIAGNOSIS — Z87891 Personal history of nicotine dependence: Secondary | ICD-10-CM | POA: Diagnosis not present

## 2024-12-18 ENCOUNTER — Ambulatory Visit: Admitting: Podiatry

## 2024-12-18 DIAGNOSIS — B351 Tinea unguium: Secondary | ICD-10-CM | POA: Diagnosis not present

## 2024-12-18 DIAGNOSIS — M79675 Pain in left toe(s): Secondary | ICD-10-CM

## 2024-12-18 DIAGNOSIS — M79674 Pain in right toe(s): Secondary | ICD-10-CM

## 2024-12-18 NOTE — Progress Notes (Unsigned)
   Subjective:  Patient ID: Cathy Newman, female    DOB: June 03, 1932,  MRN: [id number]  Chief Complaint  Patient presents with   Nail Problem    Nail trim    89 y.o. female returns for the above complaint.  Patient presents with thickened elongated dystrophic mycotic toenails x 10 mild pain on palpation hurts with ambulation hurts with pressure she would like me to be done she is not able to do it herself.  Objective:  There were no vitals filed for this visit. Podiatric Exam: Vascular: dorsalis pedis and posterior tibial pulses are palpable bilateral. Capillary return is immediate. Temperature gradient is WNL. Skin turgor WNL  Sensorium: Normal Semmes Weinstein monofilament test. Normal tactile sensation bilaterally. Nail Exam: Pt has thick disfigured discolored nails with subungual debris noted bilateral entire nail hallux through fifth toenails.  Pain on palpation to the nails. Ulcer Exam: There is no evidence of ulcer or pre-ulcerative changes or infection. Orthopedic Exam: Muscle tone and strength are WNL. No limitations in general ROM. No crepitus or effusions noted.  Skin: No Porokeratosis. No infection or ulcers    Assessment & Plan:   1. Pain due to onychomycosis of toenails of both feet      Patient was evaluated and treated and all questions answered.  Onychomycosis with pain  -Nails palliatively debrided as below. -Educated on self-care  Procedure: Nail Debridement Rationale: pain  Type of Debridement: manual, sharp debridement. Instrumentation: Nail nipper, rotary burr. Number of Nails: 10  Procedures and Treatment: Consent by patient was obtained for treatment procedures. The patient understood the discussion of treatment and procedures well. All questions were answered thoroughly reviewed. Debridement of mycotic and hypertrophic toenails, 1 through 5 bilateral and clearing of subungual debris. No ulceration, no infection noted.  Return Visit-Office Procedure:  Patient instructed to return to the office for a follow up visit 3 months for continued evaluation and treatment.  Nicholes Rough, DPM    No follow-ups on file.

## 2025-02-21 ENCOUNTER — Ambulatory Visit: Admitting: Podiatry
